# Patient Record
Sex: Female | Born: 1955
Health system: Southern US, Community
[De-identification: ages and names within clinical notes are randomized; demographics above are authoritative.]

## PROBLEM LIST (undated history)

## (undated) DIAGNOSIS — B182 Chronic viral hepatitis C: Secondary | ICD-10-CM

## (undated) DIAGNOSIS — I10 Essential (primary) hypertension: Secondary | ICD-10-CM

## (undated) DIAGNOSIS — Z5189 Encounter for other specified aftercare: Secondary | ICD-10-CM

## (undated) HISTORY — DX: Encounter for other specified aftercare: Z51.89

## (undated) HISTORY — PX: ABDOMINAL HYSTERECTOMY: SHX81

## (undated) HISTORY — DX: Essential (primary) hypertension: I10

---
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
## 2000-04-19 ENCOUNTER — Ambulatory Visit (HOSPITAL_COMMUNITY): Admission: RE | Admit: 2000-04-19 | Discharge: 2000-04-19 | Payer: Self-pay | Admitting: Family Medicine

## 2000-04-19 ENCOUNTER — Encounter: Payer: Self-pay | Admitting: Family Medicine

## 2014-03-18 ENCOUNTER — Inpatient Hospital Stay (HOSPITAL_COMMUNITY)
Admission: AD | Admit: 2014-03-18 | Payer: BC Managed Care – PPO | Source: Ambulatory Visit | Admitting: Obstetrics and Gynecology

## 2018-12-21 ENCOUNTER — Other Ambulatory Visit: Payer: Self-pay

## 2018-12-21 ENCOUNTER — Emergency Department (HOSPITAL_COMMUNITY)
Admission: EM | Admit: 2018-12-21 | Discharge: 2018-12-21 | Disposition: A | Discharge status: Home / Self Care | Payer: BLUE CROSS/BLUE SHIELD | Attending: Emergency Medicine | Admitting: Emergency Medicine

## 2018-12-21 ENCOUNTER — Encounter (HOSPITAL_COMMUNITY): Payer: Self-pay

## 2018-12-21 DIAGNOSIS — R5383 Other fatigue: Secondary | ICD-10-CM | POA: Insufficient documentation

## 2018-12-21 DIAGNOSIS — I10 Essential (primary) hypertension: Secondary | ICD-10-CM

## 2018-12-21 DIAGNOSIS — R5381 Other malaise: Secondary | ICD-10-CM | POA: Insufficient documentation

## 2018-12-21 DIAGNOSIS — T7840XA Allergy, unspecified, initial encounter: Secondary | ICD-10-CM | POA: Diagnosis present

## 2018-12-21 LAB — COMPREHENSIVE METABOLIC PANEL
ALT: 115 U/L — ABNORMAL HIGH (ref 0–44)
AST: 67 U/L — ABNORMAL HIGH (ref 15–41)
Albumin: 3.8 g/dL (ref 3.5–5.0)
Alkaline Phosphatase: 97 U/L (ref 38–126)
Anion gap: 10 (ref 5–15)
BUN: 9 mg/dL (ref 8–23)
CO2: 25 mmol/L (ref 22–32)
Calcium: 8.8 mg/dL — ABNORMAL LOW (ref 8.9–10.3)
Chloride: 103 mmol/L (ref 98–111)
Creatinine, Ser: 0.66 mg/dL (ref 0.44–1.00)
GFR calc Af Amer: >60 mL/min (ref >60)
GFR calc non Af Amer: >60 mL/min (ref >60)
Glucose, Bld: 93 mg/dL (ref 70–99)
Potassium: 4.3 mmol/L (ref 3.5–5.1)
Sodium: 138 mmol/L (ref 135–145)
Total Bilirubin: 0.5 mg/dL (ref 0.3–1.2)
Total Protein: 7.3 g/dL (ref 6.5–8.1)

## 2018-12-21 LAB — CBC WITH DIFFERENTIAL/PLATELET
Abs Immature Granulocytes: 0.01 10*3/uL (ref 0.00–0.07)
Basophils Absolute: 0 10*3/uL (ref 0.0–0.1)
Basophils Relative: 1 %
Eosinophils Absolute: 0.2 10*3/uL (ref 0.0–0.5)
Eosinophils Relative: 3 %
HCT: 41.7 % (ref 36.0–46.0)
Hemoglobin: 13.2 g/dL (ref 12.0–15.0)
Immature Granulocytes: 0 %
Lymphocytes Relative: 36 %
Lymphs Abs: 2.4 10*3/uL (ref 0.7–4.0)
MCH: 28.8 pg (ref 26.0–34.0)
MCHC: 31.7 g/dL (ref 30.0–36.0)
MCV: 91 fL (ref 80.0–100.0)
Monocytes Absolute: 0.9 10*3/uL (ref 0.1–1.0)
Monocytes Relative: 13 %
Neutro Abs: 3.2 10*3/uL (ref 1.7–7.7)
Neutrophils Relative %: 47 %
Platelets: 183 10*3/uL (ref 150–400)
RBC: 4.58 MIL/uL (ref 3.87–5.11)
RDW: 14.3 % (ref 11.5–15.5)
WBC: 6.8 10*3/uL (ref 4.0–10.5)
nRBC: 0 % (ref 0.0–0.2)

## 2018-12-21 MED ORDER — LISINOPRIL 10 MG PO TABS: 10.0000 mg | ORAL_TABLET | Freq: Every day | ORAL | 0 refills | Status: DC

## 2018-12-21 NOTE — Discharge Instructions (Signed)
Recommend calling your primary doctor's office to get help setting up new primary care doctor.  Recommend taking the newly prescribed medicine.  If you develop any cough after starting his medicine, recommend stopping medication and discussing other with your primary care doctor.  If you develop any tongue swelling, lip swelling after starting this medicine, please return to the emergency department for assessment and stop taking the medication.  If you develop fever, difficulty breathing, neck stiffness, difficulty swallowing, or other new concerning symptoms recommend returning here for reassessment.

## 2018-12-21 NOTE — ED Notes (Signed)
Discharge instructions reviewed with patient. Patient verbalizes understanding. VSS.  Patient verbalizes intention to call and schedule PCP appointment on her way out of the ED parking lot.

## 2018-12-21 NOTE — ED Triage Notes (Addendum)
Patient reports Monday night she noticed a bug bite on right hand and right arm. Arm was itchy and achy.  Thursday at work patient began to have a headache, fatigue, and lightheaded.   Reports swollen lymph nodes.   C/o bloating, cramps in lower abdomen, and hot flashes.   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   BP- 212/111

## 2018-12-21 NOTE — ED Provider Notes (Signed)
[facility] DEPT Provider Note   CSN: [id number] Arrival date & time: 12/21/18  1201    History   Chief Complaint Chief Complaint  Patient presents with  . Allergic Reaction  . Fatigue    HPI Debbie Barnett is a 63 y.o. female.  Presents emerged department with multiple complaints.  Patient states over the past week she has felt increased generalized fatigue, generalized weakness.  Last week also having some intermittent headaches.  Currently no headache.  States that she feels more tired than normal.  Last week, she was having some lower abdominal cramping that reminded her of period cramps.  Also states last week she was having some hot flashes.  Has a hysterectomy in the 90s.  Patient also reported that on Monday night shegot two bug bites.  One on her right hand, one on her right forearm.  After this she noted significant area of redness, swelling that tracked up her right forearm.  States that this spontaneously subsided and now is resolved.  States over the past few days though she has noted some increased fullness sensation through her right arm, right side of her neck.  States that she has some neck discomfort sensation on the right side.  Denies frank neck pain, noted some pain with swallowing.  No change in voice, no neck stiffness, no difficulty with swallowing.  Patient denies history of any medical problems.  Takes no chronic medications.  States her PCP retired after [employer] pandemic started and this is why she came here today because she currently does not have a PCP but is interested in getting set up with one.      HPI  History reviewed. No pertinent past medical history.  There are no active problems to display for this patient.   History reviewed. No pertinent surgical history.   OB History   No obstetric history on file.      Home Medications    Prior to Admission medications   Medication Sig Start Date End Date Taking?  Authorizing Provider  lisinopril (ZESTRIL) 10 MG tablet Take 1 tablet (10 mg total) by mouth daily. 12/21/18   Lucrezia Starch, MD    Family History No family history on file.  Social History Social History   Tobacco Use  . Smoking status: Never Smoker  . Smokeless tobacco: Never Used  Substance Use Topics  . Alcohol use: Never    Frequency: Never  . Drug use: Not on file     Allergies   Patient has no allergy information on record.   Review of Systems Review of Systems  Constitutional: Positive for fatigue. Negative for chills and fever.  HENT: Negative for ear pain, sore throat and voice change.   Eyes: Negative for pain and visual disturbance.  Respiratory: Negative for cough and shortness of breath.   Cardiovascular: Negative for chest pain and palpitations.  Gastrointestinal: Negative for abdominal pain and vomiting.  Genitourinary: Negative for dysuria and hematuria.  Musculoskeletal: Negative for arthralgias and back pain.  Skin: Positive for rash. Negative for color change.  Neurological: Negative for seizures and syncope.  All other systems reviewed and are negative.    Physical Exam Updated Vital Signs BP (!) 206/98   Pulse 72   Temp 98.3 F (36.8 C) (Oral)   Resp 18   SpO2 99%   Physical Exam Vitals signs and nursing note reviewed.  Constitutional:      General: She is not in acute distress.  Appearance: She is well-developed.  HENT:     Head: Normocephalic and atraumatic.     Nose: Nose normal. No congestion.     Mouth/Throat:     Mouth: Mucous membranes are moist.     Pharynx: Oropharynx is clear. No oropharyngeal exudate.  Eyes:     Conjunctiva/sclera: Conjunctivae normal.     Pupils: Pupils are equal, round, and reactive to light.  Neck:     Musculoskeletal: Neck supple. No neck rigidity or muscular tenderness.  Cardiovascular:     Rate and Rhythm: Normal rate and regular rhythm.     Heart sounds: No murmur.  Pulmonary:      Effort: Pulmonary effort is normal. No respiratory distress.     Breath sounds: Normal breath sounds.  Abdominal:     Palpations: Abdomen is soft.     Tenderness: There is no abdominal tenderness.  Musculoskeletal:        General: No swelling or tenderness.  Lymphadenopathy:     Cervical: No cervical adenopathy.  Skin:    General: Skin is warm and dry.     Capillary Refill: Capillary refill takes less than 2 seconds.  Neurological:     Mental Status: She is alert.      ED Treatments / Results  Labs (all labs ordered are listed, but only abnormal results are displayed) Labs Reviewed  COMPREHENSIVE METABOLIC PANEL - Abnormal; Notable for the following components:      Result Value   Calcium 8.8 (*)    AST 67 (*)    ALT 115 (*)    All other components within normal limits  CBC WITH DIFFERENTIAL/PLATELET    EKG None  Radiology No results found.  Procedures Procedures (including critical care time)  Medications Ordered in ED Medications - No data to display   Initial Impression / Assessment and Plan / ED Course  I have reviewed the triage vital signs and the nursing notes.  Pertinent labs & imaging results that were available during my care of the patient were reviewed by me and considered in my medical decision making (see chart for details).        62 year old lady presents with 1 week history of generalized fatigue and right arm, right neck fullness sensation after recent insect bites to right hand.  Regarding generalized fatigue symptoms, no clear etiology at this time.  Suspect possible viral illness given consolation of symptoms.  Did not appreciate any significant swelling in her neck, normal neck range of motion, clear posterior oropharynx.  Labs were unremarkable.  At time of presentation here patient was very well-appearing, normal vital signs, except high blood pressure.  Suspect undiagnosed essential hypertension. Will start course of antihypertensive and  urged close PCP follow-up.  Final Clinical Impressions(s) / ED Diagnoses   Final diagnoses:  Malaise and fatigue  Hypertension, unspecified type    ED Discharge Orders         Ordered    lisinopril (ZESTRIL) 10 MG tablet  Daily,   Status:  Discontinued     12/21/18 1407    lisinopril (ZESTRIL) 10 MG tablet  Daily     12/21/18 1410           Lucrezia Starch, MD 12/21/18 1413

## 2019-01-16 ENCOUNTER — Inpatient Hospital Stay: Payer: BLUE CROSS/BLUE SHIELD | Admitting: Family Medicine

## 2019-01-17 ENCOUNTER — Encounter: Payer: Self-pay | Admitting: Pharmacist

## 2019-01-17 ENCOUNTER — Encounter: Payer: Self-pay | Admitting: Critical Care Medicine

## 2019-01-17 ENCOUNTER — Other Ambulatory Visit: Payer: Self-pay

## 2019-01-17 ENCOUNTER — Ambulatory Visit: Payer: BLUE CROSS/BLUE SHIELD | Attending: Critical Care Medicine | Admitting: Critical Care Medicine

## 2019-01-17 ENCOUNTER — Ambulatory Visit (HOSPITAL_BASED_OUTPATIENT_CLINIC_OR_DEPARTMENT_OTHER): Payer: BLUE CROSS/BLUE SHIELD | Admitting: Pharmacist

## 2019-01-17 VITALS — BP 158/80 | HR 70 | Temp 98.2°F | Wt 160.2 lb

## 2019-01-17 DIAGNOSIS — Z23 Encounter for immunization: Secondary | ICD-10-CM

## 2019-01-17 DIAGNOSIS — Z1239 Encounter for other screening for malignant neoplasm of breast: Secondary | ICD-10-CM

## 2019-01-17 DIAGNOSIS — Z114 Encounter for screening for human immunodeficiency virus [HIV]: Secondary | ICD-10-CM

## 2019-01-17 DIAGNOSIS — R51 Headache: Secondary | ICD-10-CM | POA: Diagnosis not present

## 2019-01-17 DIAGNOSIS — I10 Essential (primary) hypertension: Secondary | ICD-10-CM | POA: Diagnosis not present

## 2019-01-17 DIAGNOSIS — Z1159 Encounter for screening for other viral diseases: Secondary | ICD-10-CM

## 2019-01-17 DIAGNOSIS — Z1211 Encounter for screening for malignant neoplasm of colon: Secondary | ICD-10-CM

## 2019-01-17 MED ORDER — LISINOPRIL-HYDROCHLOROTHIAZIDE 20-25 MG PO TABS: 1.0000 | ORAL_TABLET | Freq: Every day | ORAL | 1 refills | Status: DC

## 2019-01-17 NOTE — Progress Notes (Signed)
Patient presents for vaccination against influenza and tetanus per orders of Dr. Joya Gaskins. Consent given. Counseling provided. No contraindications exists. Vaccine administered without incident.

## 2019-01-17 NOTE — Patient Instructions (Signed)
Follow a Dash diet as outlined below for high blood pressure  Change lisinopril to lisinopril HCT 1 tablet daily, this medication was sent to your [facility] pharmacy  We will schedule a mammogram and give you a stool kit to screen for colon cancer  Tetanus vaccine and flu vaccine was given  Labs today include a hepatitis C, HIV screening along with complete metabolic panel complete blood count lipid panel and thyroid panel we will call you with results  Return in 1 month for blood pressure recheck and repeat evaluation with Dr. Joya Gaskins   Managing Your Hypertension Hypertension is commonly called high blood pressure. This is when the force of your blood pressing against the walls of your arteries is too strong. Arteries are blood vessels that carry blood from your heart throughout your body. Hypertension forces the heart to work harder to pump blood, and may cause the arteries to become narrow or stiff. Having untreated or uncontrolled hypertension can cause heart attack, stroke, kidney disease, and other problems. What are blood pressure readings? A blood pressure reading consists of a higher number over a lower number. Ideally, your blood pressure should be below 120/80. The first ("top") number is called the systolic pressure. It is a measure of the pressure in your arteries as your heart beats. The second ("bottom") number is called the diastolic pressure. It is a measure of the pressure in your arteries as the heart relaxes. What does my blood pressure reading mean? Blood pressure is classified into four stages. Based on your blood pressure reading, your health care provider may use the following stages to determine what type of treatment you need, if any. Systolic pressure and diastolic pressure are measured in a unit called mm Hg. Normal  Systolic pressure: below 277.  Diastolic pressure: below 80. Elevated  Systolic pressure: 824-235.  Diastolic pressure: below 80. Hypertension stage  1  Systolic pressure: 361-443.  Diastolic pressure: 15-40. Hypertension stage 2  Systolic pressure: 086 or above.  Diastolic pressure: 90 or above. What health risks are associated with hypertension? Managing your hypertension is an important responsibility. Uncontrolled hypertension can lead to:  A heart attack.  A stroke.  A weakened blood vessel (aneurysm).  Heart failure.  Kidney damage.  Eye damage.  Metabolic syndrome.  Memory and concentration problems. What changes can I make to manage my hypertension? Hypertension can be managed by making lifestyle changes and possibly by taking medicines. Your health care provider will help you make a plan to bring your blood pressure within a normal range. Eating and drinking   Eat a diet that is high in fiber and potassium, and low in salt (sodium), added sugar, and fat. An example eating plan is called the DASH (Dietary Approaches to Stop Hypertension) diet. To eat this way: ? Eat plenty of fresh fruits and vegetables. Try to fill half of your plate at each meal with fruits and vegetables. ? Eat whole grains, such as whole wheat pasta, brown rice, or whole grain bread. Fill about one quarter of your plate with whole grains. ? Eat low-fat diary products. ? Avoid fatty cuts of meat, processed or cured meats, and poultry with skin. Fill about one quarter of your plate with lean proteins such as fish, chicken without skin, beans, eggs, and tofu. ? Avoid premade and processed foods. These tend to be higher in sodium, added sugar, and fat.  Reduce your daily sodium intake. Most people with hypertension should eat less than 1,500 mg of sodium a  day.  Limit alcohol intake to no more than 1 drink a day for nonpregnant women and 2 drinks a day for men. One drink equals 12 oz of beer, 5 oz of wine, or 1 oz of hard liquor. Lifestyle  Work with your health care provider to maintain a healthy body weight, or to lose weight. Ask what an  ideal weight is for you.  Get at least 30 minutes of exercise that causes your heart to beat faster (aerobic exercise) most days of the week. Activities may include walking, swimming, or biking.  Include exercise to strengthen your muscles (resistance exercise), such as weight lifting, as part of your weekly exercise routine. Try to do these types of exercises for 30 minutes at least 3 days a week.  Do not use any products that contain nicotine or tobacco, such as cigarettes and e-cigarettes. If you need help quitting, ask your health care provider.  Control any long-term (chronic) conditions you have, such as high cholesterol or diabetes. Monitoring  Monitor your blood pressure at home as told by your health care provider. Your personal target blood pressure may vary depending on your medical conditions, your age, and other factors.  Have your blood pressure checked regularly, as often as told by your health care provider. Working with your health care provider  Review all the medicines you take with your health care provider because there may be side effects or interactions.  Talk with your health care provider about your diet, exercise habits, and other lifestyle factors that may be contributing to hypertension.  Visit your health care provider regularly. Your health care provider can help you create and adjust your plan for managing hypertension. Will I need medicine to control my blood pressure? Your health care provider may prescribe medicine if lifestyle changes are not enough to get your blood pressure under control, and if:  Your systolic blood pressure is 130 or higher.  Your diastolic blood pressure is 80 or higher. Take medicines only as told by your health care provider. Follow the directions carefully. Blood pressure medicines must be taken as prescribed. The medicine does not work as well when you skip doses. Skipping doses also puts you at risk for problems. Contact a  health care provider if:  You think you are having a reaction to medicines you have taken.  You have repeated (recurrent) headaches.  You feel dizzy.  You have swelling in your ankles.  You have trouble with your vision. Get help right away if:  You develop a severe headache or confusion.  You have unusual weakness or numbness, or you feel faint.  You have severe pain in your chest or abdomen.  You vomit repeatedly.  You have trouble breathing. Summary  Hypertension is when the force of blood pumping through your arteries is too strong. If this condition is not controlled, it may put you at risk for serious complications.  Your personal target blood pressure may vary depending on your medical conditions, your age, and other factors. For most people, a normal blood pressure is less than 120/80.  Hypertension is managed by lifestyle changes, medicines, or both. Lifestyle changes include weight loss, eating a healthy, low-sodium diet, exercising more, and limiting alcohol. This information is not intended to replace advice given to you by your health care provider. Make sure you discuss any questions you have with your health care provider. Document Released: 01/25/2012 Document Revised: 08/24/2018 Document Reviewed: 03/30/2016 Elsevier Patient Education  [place].  Hypertension,  Adult Hypertension is another name for high blood pressure. High blood pressure forces your heart to work harder to pump blood. This can cause problems over time. There are two numbers in a blood pressure reading. There is a top number (systolic) over a bottom number (diastolic). It is best to have a blood pressure that is below 120/80. Healthy choices can help lower your blood pressure, or you may need medicine to help lower it. What are the causes? The cause of this condition is not known. Some conditions may be related to high blood pressure. What increases the risk?  Smoking.  Having type  2 diabetes mellitus, high cholesterol, or both.  Not getting enough exercise or physical activity.  Being overweight.  Having too much fat, sugar, calories, or salt (sodium) in your diet.  Drinking too much alcohol.  Having long-term (chronic) kidney disease.  Having a family history of high blood pressure.  Age. Risk increases with age.  Race. You may be at higher risk if you are African American.  Gender. Men are at higher risk than women before age 6. After age 24, women are at higher risk than men.  Having obstructive sleep apnea.  Stress. What are the signs or symptoms?  High blood pressure may not cause symptoms. Very high blood pressure (hypertensive crisis) may cause: ? Headache. ? Feelings of worry or nervousness (anxiety). ? Shortness of breath. ? Nosebleed. ? A feeling of being sick to your stomach (nausea). ? Throwing up (vomiting). ? Changes in how you see. ? Very bad chest pain. ? Seizures. How is this treated?  This condition is treated by making healthy lifestyle changes, such as: ? Eating healthy foods. ? Exercising more. ? Drinking less alcohol.  Your health care provider may prescribe medicine if lifestyle changes are not enough to get your blood pressure under control, and if: ? Your top number is above 130. ? Your bottom number is above 80.  Your personal target blood pressure may vary. Follow these instructions at home: Eating and drinking   If told, follow the DASH eating plan. To follow this plan: ? Fill one half of your plate at each meal with fruits and vegetables. ? Fill one fourth of your plate at each meal with whole grains. Whole grains include whole-wheat pasta, brown rice, and whole-grain bread. ? Eat or drink low-fat dairy products, such as skim milk or low-fat yogurt. ? Fill one fourth of your plate at each meal with low-fat (lean) proteins. Low-fat proteins include fish, chicken without skin, eggs, beans, and tofu. ? Avoid  fatty meat, cured and processed meat, or chicken with skin. ? Avoid pre-made or processed food.  Eat less than 1,500 mg of salt each day.  Do not drink alcohol if: ? Your doctor tells you not to drink. ? You are pregnant, may be pregnant, or are planning to become pregnant.  If you drink alcohol: ? Limit how much you use to:  0-1 drink a day for women.  0-2 drinks a day for men. ? Be aware of how much alcohol is in your drink. In the [place] one drink equals one 12 oz bottle of beer (355 mL), one 5 oz glass of wine (148 mL), or one 1 oz glass of hard liquor (44 mL). Lifestyle   Work with your doctor to stay at a healthy weight or to lose weight. Ask your doctor what the best weight is for you.  Get at least 30 minutes of exercise most  days of the week. This may include walking, swimming, or biking.  Get at least 30 minutes of exercise that strengthens your muscles (resistance exercise) at least 3 days a week. This may include lifting weights or doing Pilates.  Do not use any products that contain nicotine or tobacco, such as cigarettes, e-cigarettes, and chewing tobacco. If you need help quitting, ask your doctor.  Check your blood pressure at home as told by your doctor.  Keep all follow-up visits as told by your doctor. This is important. Medicines  Take over-the-counter and prescription medicines only as told by your doctor. Follow directions carefully.  Do not skip doses of blood pressure medicine. The medicine does not work as well if you skip doses. Skipping doses also puts you at risk for problems.  Ask your doctor about side effects or reactions to medicines that you should watch for. Contact a doctor if you:  Think you are having a reaction to the medicine you are taking.  Have headaches that keep coming back (recurring).  Feel dizzy.  Have swelling in your ankles.  Have trouble with your vision. Get help right away if you:  Get a very bad headache.  Start  to feel mixed up (confused).  Feel weak or numb.  Feel faint.  Have very bad pain in your: ? Chest. ? Belly (abdomen).  Throw up more than once.  Have trouble breathing. Summary  Hypertension is another name for high blood pressure.  High blood pressure forces your heart to work harder to pump blood.  For most people, a normal blood pressure is less than 120/80.  Making healthy choices can help lower blood pressure. If your blood pressure does not get lower with healthy choices, you may need to take medicine. This information is not intended to replace advice given to you by your health care provider. Make sure you discuss any questions you have with your health care provider. Document Released: 10/19/2007 Document Revised: 01/10/2018 Document Reviewed: 01/10/2018 Elsevier Patient Education  2020 [employer].

## 2019-01-17 NOTE — Progress Notes (Signed)
Subjective:    Patient ID: Debbie Barnett, female    DOB: 12-Sep-1955, 63 y.o.   MRN: [id number]  This is a pleasant 63 year old female who presented to the emergency room recently with fatigue weakness right arm swelling and neck pain which appear to generate from 2 insect bites.  This occurred in August and she was treated in the emergency room at that time she was found to have elevated blood pressure and they recommended institution of lisinopril 10 mg daily and referred her to our clinic to establish.  Patient's not had primary care in several years.  She does have previous history of hysterectomy in the 1990s.  She has no real headache there is no chest pain there is no shortness of breath there is no swelling in the lower extremities as well.  The patient is a lifelong never smoker and does not use alcohol the patient works for the [employer]   Hypertension This is a new problem. The current episode started more than 1 month ago. The problem is unchanged. The problem is uncontrolled. Associated symptoms include headaches. Pertinent negatives include no anxiety, blurred vision, chest pain, malaise/fatigue, neck pain, orthopnea, palpitations, peripheral edema, PND, shortness of breath or sweats. There are no known risk factors for coronary artery disease. Past treatments include ACE inhibitors. The current treatment provides moderate improvement. Compliance problems include exercise and diet.  There is no history of angina, kidney disease, CAD/MI, CVA, heart failure, left ventricular hypertrophy, PVD or retinopathy. There is no history of chronic renal disease, coarctation of the aorta, hyperaldosteronism, hypercortisolism, hyperparathyroidism, a hypertension causing med, pheochromocytoma, renovascular disease, sleep apnea or a thyroid problem.   Past Medical History:  Diagnosis Date  . Hypertension      History reviewed. No pertinent family history.   Social History    Socioeconomic History  . Marital status: Single    Spouse name: Not on file  . Number of children: Not on file  . Years of education: Not on file  . Highest education level: Not on file  Occupational History  . Not on file  Social Needs  . Financial resource strain: Not on file  . Food insecurity    Worry: Not on file    Inability: Not on file  . Transportation needs    Medical: Not on file    Non-medical: Not on file  Tobacco Use  . Smoking status: Never Smoker  . Smokeless tobacco: Never Used  Substance and Sexual Activity  . Alcohol use: Never    Frequency: Never  . Drug use: Never  . Sexual activity: Yes  Lifestyle  . Physical activity    Days per week: Not on file    Minutes per session: Not on file  . Stress: Not on file  Relationships  . Social Herbalist on phone: Not on file    Gets together: Not on file    Attends religious service: Not on file    Active member of club or organization: Not on file    Attends meetings of clubs or organizations: Not on file    Relationship status: Not on file  . Intimate partner violence    Fear of current or ex partner: Not on file    Emotionally abused: Not on file    Physically abused: Not on file    Forced sexual activity: Not on file  Other Topics Concern  . Not on file  Social History  Narrative  . Not on file     No Known Allergies   Outpatient Medications Prior to Visit  Medication Sig Dispense Refill  . lisinopril (ZESTRIL) 10 MG tablet Take 1 tablet (10 mg total) by mouth daily. 30 tablet 0   No facility-administered medications prior to visit.       Review of Systems  Constitutional: Negative for malaise/fatigue.  Eyes: Negative for blurred vision.  Respiratory: Negative for shortness of breath.   Cardiovascular: Negative for chest pain, palpitations, orthopnea and PND.  Musculoskeletal: Negative for neck pain.  Neurological: Positive for headaches.       Objective:   Physical Exam  Vitals:   01/17/19 1047  BP: (!) 158/80  Pulse: 70  Temp: 98.2 F (36.8 C)  TempSrc: Oral  SpO2: 98%  Weight: 160 lb 3.2 oz (72.7 kg)    Gen: Pleasant, well-nourished, in no distress,  normal affect  ENT: No lesions,  mouth clear,  oropharynx clear, no postnasal drip  Neck: No JVD, no TMG, no carotid bruits  Lungs: No use of accessory muscles, no dullness to percussion, clear without rales or rhonchi  Cardiovascular: RRR, heart sounds normal, no murmur or gallops, no peripheral edema  Abdomen: soft and NT, no HSM,  BS normal  Musculoskeletal: No deformities, no cyanosis or clubbing  Neuro: alert, non focal  Skin: Warm, no lesions or rashes   BMP Latest Ref Rng & Units 12/21/2018  Glucose 70 - 99 mg/dL 93  BUN 8 - 23 mg/dL 9  Creatinine 0.44 - 1.00 mg/dL 0.66  Sodium 135 - 145 mmol/L 138  Potassium 3.5 - 5.1 mmol/L 4.3  Chloride 98 - 111 mmol/L 103  CO2 22 - 32 mmol/L 25  Calcium 8.9 - 10.3 mg/dL 8.8(L)   Hepatic Function Latest Ref Rng & Units 12/21/2018  Total Protein 6.5 - 8.1 g/dL 7.3  Albumin 3.5 - 5.0 g/dL 3.8  AST 15 - 41 U/L 67(H)  ALT 0 - 44 U/L 115(H)  Alk Phosphatase 38 - 126 U/L 97  Total Bilirubin 0.3 - 1.2 mg/dL 0.5   CBC Latest Ref Rng & Units 12/21/2018  WBC 4.0 - 10.5 K/uL 6.8  Hemoglobin 12.0 - 15.0 g/dL 13.2  Hematocrit 36.0 - 46.0 % 41.7  Platelets 150 - 400 K/uL 183        Assessment & Plan:  I personally reviewed all images and lab data in the [facility] system as well as any outside material available during this office visit and agree with the  radiology impressions.   HTN (hypertension) Hypertension newly diagnosed but not yet well controlled on 10 mg daily lisinopril  Plan will be to increase to lisinopril HCT 20/25 mg daily  We will recheck a complete metabolic panel complete blood count thyroid panel lipid panel  A low-salt DASH diet was given to the patient for consideration and she was recommended to increase her daily exercise      Debbie Barnett was seen today for hospitalization follow-up.  Diagnoses and all orders for this visit:  Essential hypertension -     Comprehensive metabolic panel -     CBC with Differential/Platelet; Future -     Lipid panel -     Thyroid Profile -     TSH -     CBC with Differential/Platelet  Colon cancer screening -     Fecal occult blood, imunochemical  Encounter for screening for HIV -     HIV Antibody (routine testing w rflx)  Breast cancer screening -     MM DIGITAL SCREENING BILATERAL; Future  Need for hepatitis C screening test -     Hepatitis C antibody  Other orders -     lisinopril-hydrochlorothiazide (ZESTORETIC) 20-25 MG tablet; Take 1 tablet by mouth daily.  We also will order a mammogram, HIV screening, hepatitis C screening along with fecal occult blood for colon cancer screening  We also administered a flu vaccine today at this visit  We will have the patient establish and return in follow-up note she has had a hysterectomy therefore does not need a Pap smear any longer as it was a complete hysterectomy

## 2019-01-17 NOTE — Assessment & Plan Note (Signed)
Hypertension newly diagnosed but not yet well controlled on 10 mg daily lisinopril  Plan will be to increase to lisinopril HCT 20/25 mg daily  We will recheck a complete metabolic panel complete blood count thyroid panel lipid panel  A low-salt DASH diet was given to the patient for consideration and she was recommended to increase her daily exercise

## 2019-01-18 ENCOUNTER — Telehealth: Payer: Self-pay | Admitting: Critical Care Medicine

## 2019-01-18 DIAGNOSIS — R768 Other specified abnormal immunological findings in serum: Secondary | ICD-10-CM

## 2019-01-18 DIAGNOSIS — B182 Chronic viral hepatitis C: Secondary | ICD-10-CM

## 2019-01-18 HISTORY — DX: Chronic viral hepatitis C: B18.2

## 2019-01-18 LAB — HEPATITIS C ANTIBODY: Hep C Virus Ab: >11 s/co ratio — ABNORMAL HIGH (ref 0.0–0.9)

## 2019-01-18 LAB — COMPREHENSIVE METABOLIC PANEL
ALT: 34 IU/L — ABNORMAL HIGH (ref 0–32)
AST: 33 IU/L (ref 0–40)
Albumin/Globulin Ratio: 1.8 (ref 1.2–2.2)
Albumin: 4.3 g/dL (ref 3.8–4.8)
Alkaline Phosphatase: 87 IU/L (ref 39–117)
BUN/Creatinine Ratio: 25 (ref 12–28)
BUN: 17 mg/dL (ref 8–27)
Bilirubin Total: 0.3 mg/dL (ref 0.0–1.2)
CO2: 24 mmol/L (ref 20–29)
Calcium: 9.4 mg/dL (ref 8.7–10.3)
Chloride: 103 mmol/L (ref 96–106)
Creatinine, Ser: 0.67 mg/dL (ref 0.57–1.00)
GFR calc Af Amer: 108 mL/min/{1.73_m2} (ref >59)
GFR calc non Af Amer: 94 mL/min/{1.73_m2} (ref >59)
Globulin, Total: 2.4 g/dL (ref 1.5–4.5)
Glucose: 105 mg/dL — ABNORMAL HIGH (ref 65–99)
Potassium: 4.5 mmol/L (ref 3.5–5.2)
Sodium: 140 mmol/L (ref 134–144)
Total Protein: 6.7 g/dL (ref 6.0–8.5)

## 2019-01-18 LAB — LIPID PANEL
Chol/HDL Ratio: 3.3 ratio (ref 0.0–4.4)
Cholesterol, Total: 174 mg/dL (ref 100–199)
HDL: 53 mg/dL (ref >39)
LDL Chol Calc (NIH): 104 mg/dL — ABNORMAL HIGH (ref 0–99)
Triglycerides: 91 mg/dL (ref 0–149)
VLDL Cholesterol Cal: 17 mg/dL (ref 5–40)

## 2019-01-18 LAB — CBC WITH DIFFERENTIAL/PLATELET
Basophils Absolute: 0 10*3/uL (ref 0.0–0.2)
Basos: 1 %
EOS (ABSOLUTE): 0.2 10*3/uL (ref 0.0–0.4)
Eos: 3 %
Hematocrit: 37.9 % (ref 34.0–46.6)
Hemoglobin: 12.9 g/dL (ref 11.1–15.9)
Immature Grans (Abs): 0 10*3/uL (ref 0.0–0.1)
Immature Granulocytes: 0 %
Lymphocytes Absolute: 2.4 10*3/uL (ref 0.7–3.1)
Lymphs: 39 %
MCH: 29.1 pg (ref 26.6–33.0)
MCHC: 34 g/dL (ref 31.5–35.7)
MCV: 85 fL (ref 79–97)
Monocytes Absolute: 0.6 10*3/uL (ref 0.1–0.9)
Monocytes: 10 %
Neutrophils Absolute: 2.9 10*3/uL (ref 1.4–7.0)
Neutrophils: 47 %
Platelets: 214 10*3/uL (ref 150–450)
RBC: 4.44 x10E6/uL (ref 3.77–5.28)
RDW: 13.7 % (ref 11.7–15.4)
WBC: 6.1 10*3/uL (ref 3.4–10.8)

## 2019-01-18 LAB — THYROID PANEL
Free Thyroxine Index: 2.4 (ref 1.2–4.9)
T3 Uptake Ratio: 22 % — ABNORMAL LOW (ref 24–39)
T4, Total: 11.1 ug/dL (ref 4.5–12.0)

## 2019-01-18 LAB — HIV ANTIBODY (ROUTINE TESTING W REFLEX): HIV Screen 4th Generation wRfx: NONREACTIVE

## 2019-01-18 LAB — TSH: TSH: 2.43 u[IU]/mL (ref 0.450–4.500)

## 2019-01-18 NOTE — Telephone Encounter (Signed)
I called the patient with lab results.  Her Hep C is positive, I will order f/u studies. The pt will come in for lab draw at [facility] on Tuesday

## 2019-01-22 ENCOUNTER — Ambulatory Visit: Payer: BLUE CROSS/BLUE SHIELD | Attending: Family Medicine

## 2019-01-22 ENCOUNTER — Other Ambulatory Visit: Payer: Self-pay

## 2019-01-22 DIAGNOSIS — R768 Other specified abnormal immunological findings in serum: Secondary | ICD-10-CM

## 2019-01-24 ENCOUNTER — Other Ambulatory Visit: Payer: Self-pay | Admitting: Critical Care Medicine

## 2019-01-24 DIAGNOSIS — R768 Other specified abnormal immunological findings in serum: Secondary | ICD-10-CM

## 2019-01-24 DIAGNOSIS — B182 Chronic viral hepatitis C: Secondary | ICD-10-CM

## 2019-01-24 HISTORY — DX: Chronic viral hepatitis C: B18.2

## 2019-01-24 LAB — HCV RNA QUANT
HCV log10: 6.076 log10 IU/mL
Hepatitis C Quantitation: 1190000 IU/mL

## 2019-01-24 NOTE — Progress Notes (Signed)
amb ref to Hep C clinic made

## 2019-02-07 ENCOUNTER — Encounter: Payer: BLUE CROSS/BLUE SHIELD | Admitting: Infectious Diseases

## 2019-02-19 ENCOUNTER — Ambulatory Visit: Payer: BLUE CROSS/BLUE SHIELD | Admitting: Critical Care Medicine

## 2019-02-27 ENCOUNTER — Telehealth: Payer: Self-pay | Admitting: Pharmacy Technician

## 2019-02-27 ENCOUNTER — Other Ambulatory Visit: Payer: Self-pay

## 2019-02-27 ENCOUNTER — Ambulatory Visit (INDEPENDENT_AMBULATORY_CARE_PROVIDER_SITE_OTHER): Payer: BLUE CROSS/BLUE SHIELD | Admitting: Infectious Diseases

## 2019-02-27 ENCOUNTER — Encounter: Payer: Self-pay | Admitting: Infectious Diseases

## 2019-02-27 VITALS — BP 159/95 | HR 68 | Temp 98.2°F | Wt 161.0 lb

## 2019-02-27 DIAGNOSIS — R7401 Elevation of levels of liver transaminase levels: Secondary | ICD-10-CM

## 2019-02-27 DIAGNOSIS — B182 Chronic viral hepatitis C: Secondary | ICD-10-CM

## 2019-02-27 NOTE — Progress Notes (Signed)
Debbie Barnett  [id number]  27-Jul-1955    HPI: The patient is a 63 y.o. y/o white female who presents today for an evaluation for HCV.  She had a positive hepatitis C antibody on January 17, 2019 which was run in part for the recent trend of transaminitis and her guidance on her age cohort.  Confirmatory testing on January 22, 2019 showed a positive hepatitis C viral load of 1,190,000 IU.  A concurrent HIV antibody was negative in September 2020.  The patient does admit to a remote solitary episode of IV drug use in the late 1970s in which she awoke having been told by friends that she used cocaine in with a needle still in her arm.  She used to commonly donate blood, and in the early 1990s, she was told she had tested positive for hepatitis and her blood could no longer be received for donation.  She does recall having a blood transfusion in conjunction with a gastroenteritis/food poisoning episode in 1996 while she was treated in a hospital [place], [place].  She does recall a jaundicing illness prior to that hospital admission.  She denies using IV drug use beyond that one episode many years ago and is never had any tattoos or body piercings other than ear piercings performed.  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  She does recall occasional needlesticks with vaccines and blood draws when attempting to draw blood from animals but denies any human to human exposure occupationally.  All of her sexual relationships have been with consistent condom use with the exception of one 3-year long relationship in the mid 90s.  After the termination of this relationship, she did learn that her former boyfriend did frequent prostitutes and likely abused IV drugs.  Past Medical History:  Diagnosis Date  . Blood transfusion without reported diagnosis    ~1996 with gastroenteritis/food poisoning  . Chronic hepatitis C  ([place]) 01/24/2019  . Hypertension     Past Surgical History:  Procedure Laterality Date  . ABDOMINAL HYSTERECTOMY       Family History  Problem Relation Age of Onset  . Kidney disease Mother   . Heart disease Father   . Obesity Sister   . Heart disease Brother   . Hypertension Brother   . Alcohol abuse Maternal Grandfather      Social History   Tobacco Use  . Smoking status: Never Smoker  . Smokeless tobacco: Never Used  Substance Use Topics  . Alcohol use: Yes    Frequency: Never    Comment: rare, approximately twice a year  . Drug use: Not Currently    Types: Cocaine, Marijuana    Comment: remote IVDU once in late 1970s      reports being sexually active. She reports using the following method of birth control/protection: Condom.   No Known Allergies   Outpatient Medications Prior to Visit  Medication Sig Dispense Refill  . lisinopril-hydrochlorothiazide (ZESTORETIC) 20-25 MG tablet Take 1 tablet by mouth daily. 90 tablet 1   No facility-administered medications prior to visit.      Review of Systems  Constitutional: Positive for fatigue. Negative for chills and fever.  HENT: Negative for congestion, hearing loss and sinus pressure.   Eyes: Negative for photophobia, discharge, redness and visual disturbance.  Respiratory: Negative for apnea, cough, shortness of breath and wheezing.   Cardiovascular: Negative for chest pain and leg swelling.  Gastrointestinal: Negative for abdominal distention, abdominal pain, constipation, diarrhea, nausea and vomiting.  Endocrine: Negative for cold intolerance, heat intolerance, polydipsia and polyuria.  Genitourinary: Negative for dysuria, flank pain, frequency, urgency, vaginal bleeding and vaginal discharge.  Musculoskeletal: Negative for arthralgias, back pain, joint swelling and neck pain.  Skin: Negative for pallor and rash.  Allergic/Immunologic: Negative for immunocompromised state.  Neurological: Negative for  dizziness, seizures, speech difficulty, weakness and headaches.  Hematological: Does not bruise/bleed easily.  Psychiatric/Behavioral: Negative for agitation, confusion, hallucinations and sleep disturbance. The patient is not nervous/anxious.      Vitals:   02/27/19 0937  BP: (!) 159/95  Pulse: 68  Temp: 98.2 F (36.8 C)     Physical Exam Gen: pleasant, NAD, A&Ox 3 Head: NCAT, no temporal wasting evident EENT: PERRL, EOMI, MMM, adequate dentition Neck: supple, no JVD CV: NRRR, no murmurs evident Pulm: CTA bilaterally, no wheeze or retractions Abd: soft, NTND, +BS Extrems: no LE edema, 2+ pulses Skin: no rashes, adequate skin turgor Neuro: CN II-XII grossly intact, no focal neurologic deficits appreciated, gait was normal, A&Ox 3   Labs: No results found for: HEPBSAG  HCV viral load on 1.19 million IU on 01/22/2019  No results found for: FIBROSTAGE  No results found for: HCVGENOTYPE  Lab Results  Component Value Date   WBC 6.1 01/17/2019   HGB 12.9 01/17/2019   HCT 37.9 01/17/2019   MCV 85 01/17/2019   PLT 214 01/17/2019       Chemistry      Component Value Date/Time   NA 140 01/17/2019 1135   K 4.5 01/17/2019 1135   CL 103 01/17/2019 1135   CO2 24 01/17/2019 1135   BUN 17 01/17/2019 1135   CREATININE 0.67 01/17/2019 1135      Component Value Date/Time   CALCIUM 9.4 01/17/2019 1135   ALKPHOS 87 01/17/2019 1135   AST 33 01/17/2019 1135   ALT 34 (H) 01/17/2019 1135   BILITOT 0.3 01/17/2019 1135        Assessment/Plan: The patient is a 63 year old white female with recent transaminitis presenting for an evaluation for chronic hepatitis C infection.  HCV - HCV Ab was positive on 01/17/2019, thus confirming past exposure to pathogen. Note: this screening test will remain positive/reactive life-long even if HCV infection has been cured/immunologically cleared. Her most likely risk factor for acquisition was past IV drug use or past sexual transmission, and less  likely her past blood transfusion (unlikely as blood supply has been HCV screened since 1994 when commercial test became available). Her HCV viral load of 1,190,000 IU confirms chronic active hepatitis C infection.  We will check an HCV genotype today. Her recent HIV Ab was negative, thus excluding co-infection.  We will check a hepatitis B surface antibody and hepatitis B surface antigen to assess for need for vaccination and risk for emergence of active hepatitis B while on DAA treatment.  As chronic HCV infection is now confirmed, will proceed with FibroScan to determine stage of fibrosis and best directly active antiviral treatment options. F/u in 3 weeks to review lab/imaging results.   Health maintenance - I have counselled the patient extensively re: the need for barrier precautions with sexual activity in order to prevent sexual transmission of HCV. She must continue these practices until 3 months after treatment completion until a sustained virologic response (SVR) has been confirmed to establish a cure of her infection. She expressed full understanding of these instructions. Vaccination for hepatitis A & B was  recommended as was abstinence from alcohol.  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

## 2019-02-27 NOTE — Telephone Encounter (Signed)
[facility] Patient Advocate Encounter  Met with the patient today during her appointment and gathered demographic data such as household size and income incase we need it for patient assistance. Her insurance plan through [employer] is commercial so more than likely a copay card will cover the cost and make the charge $5.00. If we are able to fill at [facility], the patient prefers to pick up and not have the medication mailed. I provided her information on the pharmacy and the process once labs return and medication selected.

## 2019-02-27 NOTE — Patient Instructions (Signed)
Abstain from all alcohol until liver staging completed. Return to clinic in 4 weeks to review lab results and discuss starting treatment for hepatitis C.

## 2019-02-27 NOTE — Telephone Encounter (Signed)
[facility] Patient Teacher, English as a foreign language completed.    The patient is insured through [employer].  We will continue to follow to see if copay assistance is needed.  Venida Jarvis. Nadara Mustard [place] Patient [facility] for Infectious Disease Phone: [phone] Fax:  [phone]

## 2019-03-02 LAB — HEPATITIS B SURFACE ANTIBODY, QUANTITATIVE: Hep B S AB Quant (Post): <5 m[IU]/mL — ABNORMAL LOW (ref >=10)

## 2019-03-02 LAB — HEPATITIS A ANTIBODY, TOTAL: Hepatitis A AB,Total: REACTIVE — AB

## 2019-03-02 LAB — HEPATITIS C GENOTYPE: HCV Genotype: 2

## 2019-03-02 LAB — HEPATITIS B SURFACE ANTIGEN: Hepatitis B Surface Ag: NONREACTIVE

## 2019-03-11 LAB — FECAL OCCULT BLOOD, IMMUNOCHEMICAL

## 2019-03-27 ENCOUNTER — Ambulatory Visit
Admission: RE | Admit: 2019-03-27 | Discharge: 2019-03-27 | Disposition: A | Discharge status: Home / Self Care | Payer: BLUE CROSS/BLUE SHIELD | Source: Ambulatory Visit | Attending: Infectious Diseases | Admitting: Infectious Diseases

## 2019-04-10 ENCOUNTER — Telehealth: Payer: Self-pay

## 2019-04-10 NOTE — Telephone Encounter (Signed)
Patient called request [place] results ordered by Dr. Prince Rome. After reviewing chart patient was due to return for office visit around 03/27/19. That appointment was not made prior to leaving the office. LPN scheduled patient for next available appointment and patient is fine waiting until that appointment to go iver [place] results.  Debbie Barnett

## 2019-05-01 ENCOUNTER — Telehealth: Payer: Self-pay | Admitting: Family

## 2019-05-01 DIAGNOSIS — K7400 Hepatic fibrosis, unspecified: Secondary | ICD-10-CM

## 2019-05-01 NOTE — Telephone Encounter (Signed)
[name] is a 63 year old female with genotype 2 hepatitis C with viral load of 1.19 million. APRI score of 0.386 and FIB4 of 1.69. Ultrasound with diffuse hepatocellular disease without masses or lesions. Will refer to gastroenterology for further assessment as indicated. Discussed results with [name] with questions answered. Will plan for treatment with Mavyret or Epclusa

## 2019-05-02 ENCOUNTER — Encounter: Payer: Self-pay | Admitting: Gastroenterology

## 2019-05-02 ENCOUNTER — Other Ambulatory Visit: Payer: Self-pay | Admitting: Family

## 2019-05-02 MED ORDER — SOFOSBUVIR-VELPATASVIR 400-100 MG PO TABS: 1.0000 | ORAL_TABLET | Freq: Every day | ORAL | 2 refills | Status: DC

## 2019-05-03 ENCOUNTER — Telehealth: Payer: Self-pay | Admitting: Pharmacy Technician

## 2019-05-03 NOTE — Telephone Encounter (Signed)
[facility] Patient Advocate Encounter   Received notification from [facility] that prior authorization for Raeanne Gathers is required.   PA submitted on 05/03/2019 [facility] Status is pending    [facility] will continue to follow.  Bartholomew Crews, CPhT Specialty Pharmacy Patient [facility] for Infectious Disease Phone: [phone] Fax: [phone] 05/03/2019 11:28 AM

## 2019-05-06 ENCOUNTER — Telehealth: Payer: Self-pay | Admitting: Pharmacist

## 2019-05-06 MED FILL — SOFOSBUVIR-VELPATASVIR 400-: 400-100 | 28 days supply | Qty: 28 | Fill #0

## 2019-05-06 NOTE — Telephone Encounter (Signed)
[facility] Patient Advocate Encounter  Prior Authorization for Debbie Barnett has been approved.    PA# [place] Effective dates: 05/03/2019 through 11/01/2019  Patients co-pay is $1387.33.  [facility] will continue to follow. Will need to apply for a coupon card.  Bartholomew Crews, CPhT Specialty Pharmacy Patient [facility] for Infectious Disease Phone: [phone] Fax: [phone] 05/06/2019 10:56 AM

## 2019-05-06 NOTE — Telephone Encounter (Signed)
Patient is approved to receive Epclusa x 12 weeks for chronic Hepatitis C infection. Counseled patient to take Epclusa daily with or without food. Encouraged patient not to miss any doses and explained how their chance of cure could go down with each dose missed. Counseled patient on what to do if dose is missed - if it is closer to the missed dose take immediately; if closer to next dose skip dose and take the next dose at the usual time.   Counseled patient on common side effects such as headache, fatigue, and nausea and that these normally decrease with time. I reviewed patient medications and found no drug interactions. Discussed with patient that there are several drug interactions including acid suppressants. Instructed patient to call clinic if she wishes to start a new medication during course of therapy. Also advised patient to call if she experiences any side effects. Patient will follow-up with me in the pharmacy clinic on 1/27

## 2019-05-06 NOTE — Telephone Encounter (Signed)
[facility] Patient Advocate Encounter   Was successful in obtaining an [place] card for generic Epclusa coverage. This copay card will make the patients copay $5.00.  I have spoken with the patient and she would like to pick up in person at [facility] due to her work schedule..    The billing information is RxBin: [id number] PCN: [id number] Member ID: [id number] Group ID: [id number]  Bartholomew Crews, CPhT Specialty Pharmacy Patient [facility] for Infectious Disease Phone: [phone] Fax: [phone] 05/06/2019 11:09 AM

## 2019-05-08 ENCOUNTER — Ambulatory Visit: Payer: BLUE CROSS/BLUE SHIELD | Admitting: Infectious Diseases

## 2019-05-08 ENCOUNTER — Encounter: Payer: Self-pay | Admitting: Pharmacy Technician

## 2019-05-15 ENCOUNTER — Ambulatory Visit
Admission: RE | Admit: 2019-05-15 | Discharge: 2019-05-15 | Disposition: A | Discharge status: Home / Self Care | Payer: BLUE CROSS/BLUE SHIELD | Source: Ambulatory Visit | Attending: Critical Care Medicine | Admitting: Critical Care Medicine

## 2019-05-15 ENCOUNTER — Other Ambulatory Visit: Payer: Self-pay

## 2019-05-15 ENCOUNTER — Ambulatory Visit: Payer: BLUE CROSS/BLUE SHIELD | Admitting: Family

## 2019-05-15 DIAGNOSIS — Z1239 Encounter for other screening for malignant neoplasm of breast: Secondary | ICD-10-CM

## 2019-05-30 MED FILL — SOFOSBUVIR-VELPATASVIR 400-: 400-100 | 28 days supply | Qty: 28 | Fill #1

## 2019-06-01 ENCOUNTER — Other Ambulatory Visit: Payer: Self-pay

## 2019-06-01 DIAGNOSIS — Z20822 Contact with and (suspected) exposure to covid-19: Secondary | ICD-10-CM

## 2019-06-02 LAB — NOVEL CORONAVIRUS, NAA: SARS-CoV-2, NAA: DETECTED — AB

## 2019-06-03 ENCOUNTER — Telehealth: Payer: Self-pay | Admitting: Critical Care Medicine

## 2019-06-03 ENCOUNTER — Telehealth: Payer: Self-pay | Admitting: Nurse Practitioner

## 2019-06-03 NOTE — Telephone Encounter (Signed)
I tried to call this patient,  I will call again

## 2019-06-05 ENCOUNTER — Ambulatory Visit (HOSPITAL_COMMUNITY)
Admission: RE | Admit: 2019-06-05 | Discharge: 2019-06-05 | Disposition: A | Discharge status: Home / Self Care | Payer: BLUE CROSS/BLUE SHIELD | Source: Ambulatory Visit | Attending: Pulmonary Disease | Admitting: Pulmonary Disease

## 2019-06-05 ENCOUNTER — Other Ambulatory Visit: Payer: Self-pay | Admitting: Pulmonary Disease

## 2019-06-05 DIAGNOSIS — U071 COVID-19: Secondary | ICD-10-CM | POA: Insufficient documentation

## 2019-06-05 DIAGNOSIS — I10 Essential (primary) hypertension: Secondary | ICD-10-CM

## 2019-06-05 MED ORDER — METHYLPREDNISOLONE SODIUM SUCC 125 MG IJ SOLR: 125.0000 mg | Freq: Once | INTRAMUSCULAR | Status: DC | PRN

## 2019-06-05 MED ORDER — SODIUM CHLORIDE 0.9 % IV SOLN
INTRAVENOUS | Status: DC | PRN
  Administered 2019-06-05: 250 mL via INTRAVENOUS

## 2019-06-05 MED ORDER — EPINEPHRINE 0.3 MG/0.3ML IJ SOAJ: 0.3000 mg | Freq: Once | INTRAMUSCULAR | Status: DC | PRN

## 2019-06-05 MED ORDER — DIPHENHYDRAMINE HCL 50 MG/ML IJ SOLN: 50.0000 mg | Freq: Once | INTRAMUSCULAR | Status: DC | PRN

## 2019-06-05 MED ORDER — SODIUM CHLORIDE 0.9 % IV SOLN
700.0000 mg | Freq: Once | INTRAVENOUS | Status: AC
  Administered 2019-06-05: 700 mg via INTRAVENOUS
  Filled 2019-06-05: qty 20

## 2019-06-05 MED ORDER — FAMOTIDINE IN NACL 20-0.9 MG/50ML-% IV SOLN: 20.0000 mg | Freq: Once | INTRAVENOUS | Status: DC | PRN

## 2019-06-05 MED ORDER — ALBUTEROL SULFATE HFA 108 (90 BASE) MCG/ACT IN AERS: 2.0000 | INHALATION_SPRAY | Freq: Once | RESPIRATORY_TRACT | Status: DC | PRN

## 2019-06-05 NOTE — Discharge Instructions (Signed)

## 2019-06-05 NOTE — Progress Notes (Signed)
  I connected by phone with [name] on 06/05/2019 at 8:10 AM to discuss the potential use of an new treatment for mild to moderate COVID-19 viral infection in non-hospitalized patients.  This patient is a 64 y.o. female that meets the FDA criteria for Emergency Use Authorization of bamlanivimab or casirivimab\imdevimab.  Has a (+) direct SARS-CoV-2 viral test result  Has mild or moderate COVID-19   Is ? 65 years of age and weighs ? 40 kg  Is NOT hospitalized due to COVID-19  Is NOT requiring oxygen therapy or requiring an increase in baseline oxygen flow rate due to COVID-19  Is within 10 days of symptom onset  Has at least one of the high risk factor(s) for progression to severe COVID-19 and/or hospitalization as defined in EUA.  Specific high risk criteria : Hypertension   I have spoken and communicated the following to the patient or parent/caregiver:  1. FDA has authorized the emergency use of bamlanivimab and casirivimab\imdevimab for the treatment of mild to moderate COVID-19 in adults and pediatric patients with positive results of direct SARS-CoV-2 viral testing who are 67 years of age and older weighing at least 40 kg, and who are at high risk for progressing to severe COVID-19 and/or hospitalization.  2. The significant known and potential risks and benefits of bamlanivimab and casirivimab\imdevimab, and the extent to which such potential risks and benefits are unknown.  3. Information on available alternative treatments and the risks and benefits of those alternatives, including clinical trials.  4. Patients treated with bamlanivimab and casirivimab\imdevimab should continue to self-isolate and use infection control measures (e.g., wear mask, isolate, social distance, avoid sharing personal items, clean and disinfect "high touch" surfaces, and frequent handwashing) according to CDC guidelines.   5. The patient or parent/caregiver has the option to accept or refuse  bamlanivimab or casirivimab\imdevimab .  After reviewing this information with the patient, The patient agreed to proceed with receiving the bamlanimivab infusion and will be provided a copy of the Fact sheet prior to receiving the infusion.Debbie Barnett 06/05/2019 8:10 AM

## 2019-06-05 NOTE — Progress Notes (Signed)
  Diagnosis: COVID-19  Physician: Dr. Joya Gaskins  Procedure: Covid Infusion Clinic Med: bamlanivimab infusion - Provided patient with bamlanimivab fact sheet for patients, parents and caregivers prior to infusion.  Complications: No immediate complications noted.  Discharge: Discharged home   Debbie Barnett, Debbie Barnett 06/05/2019

## 2019-06-12 ENCOUNTER — Ambulatory Visit: Payer: BLUE CROSS/BLUE SHIELD | Admitting: Gastroenterology

## 2019-06-12 ENCOUNTER — Ambulatory Visit: Payer: BLUE CROSS/BLUE SHIELD | Admitting: Pharmacist

## 2019-06-24 MED FILL — SOFOSBUVIR-VELPATASVIR 400-: 400-100 | 28 days supply | Qty: 28 | Fill #2

## 2019-07-10 ENCOUNTER — Other Ambulatory Visit: Payer: Self-pay | Admitting: Critical Care Medicine

## 2019-08-15 NOTE — Telephone Encounter (Signed)
error 

## 2019-09-21 ENCOUNTER — Ambulatory Visit: Payer: BLUE CROSS/BLUE SHIELD | Attending: Internal Medicine

## 2019-09-21 DIAGNOSIS — Z23 Encounter for immunization: Secondary | ICD-10-CM

## 2019-09-21 NOTE — Progress Notes (Signed)
   Covid-19 Vaccination Clinic  Name:  Debbie Barnett    MRN: [id number] DOB: April 06, 1956  09/21/2019  [name] was observed post Covid-19 immunization for 15 minutes without incident. She was provided with Vaccine Information Sheet and instruction to access the V-Safe system.   [name] was instructed to call 911 with any severe reactions post vaccine: Marland Kitchen Difficulty breathing  . Swelling of face and throat  . A fast heartbeat  . A bad rash all over body  . Dizziness and weakness   Immunizations Administered    Name Date Dose VIS Date Route   Pfizer COVID-19 Vaccine 09/21/2019  8:07 AM 0.3 mL 07/10/2018 Intramuscular   Manufacturer: [place]   Lot: [id number]   [place]: [id number]

## 2019-10-12 ENCOUNTER — Other Ambulatory Visit: Payer: Self-pay | Admitting: Critical Care Medicine

## 2019-10-15 ENCOUNTER — Ambulatory Visit: Payer: BLUE CROSS/BLUE SHIELD | Attending: Internal Medicine

## 2019-10-15 DIAGNOSIS — Z23 Encounter for immunization: Secondary | ICD-10-CM

## 2019-10-15 NOTE — Progress Notes (Signed)
   Covid-19 Vaccination Clinic  Name:  Debbie Barnett    MRN: [id number] DOB: 1955/07/08  10/15/2019  [name] was observed post Covid-19 immunization for 15 minutes without incident. She was provided with Vaccine Information Sheet and instruction to access the V-Safe system.   [name] was instructed to call 911 with any severe reactions post vaccine: Marland Kitchen Difficulty breathing  . Swelling of face and throat  . A fast heartbeat  . A bad rash all over body  . Dizziness and weakness   Immunizations Administered    Name Date Dose VIS Date Route   Pfizer COVID-19 Vaccine 10/15/2019  8:19 AM 0.3 mL 07/10/2018 Intramuscular   Manufacturer: [place]   Lot: [id number]   [place]: [id number]

## 2019-10-25 ENCOUNTER — Other Ambulatory Visit: Payer: Self-pay | Admitting: Critical Care Medicine

## 2019-10-25 ENCOUNTER — Telehealth: Payer: Self-pay | Admitting: Critical Care Medicine

## 2019-10-25 MED ORDER — LISINOPRIL-HYDROCHLOROTHIAZIDE 20-25 MG PO TABS: 1.0000 | ORAL_TABLET | Freq: Every day | ORAL | 0 refills | Status: DC

## 2019-10-25 NOTE — Telephone Encounter (Signed)
Patient called in and requested for listed medication to be refilled and sent to [facility] ([place]), [place] - [address]  lisinopril-hydrochlorothiazide (ZESTORETIC) 20-25 MG tablet [[id number]]

## 2019-10-25 NOTE — Telephone Encounter (Signed)
Rx sent 

## 2019-11-14 ENCOUNTER — Other Ambulatory Visit: Payer: Self-pay | Admitting: Critical Care Medicine

## 2019-11-14 MED ORDER — LISINOPRIL-HYDROCHLOROTHIAZIDE 20-25 MG PO TABS: 1.0000 | ORAL_TABLET | Freq: Every day | ORAL | 0 refills | Status: DC

## 2019-11-14 NOTE — Telephone Encounter (Signed)
Requested Prescriptions  Pending Prescriptions Disp Refills  . lisinopril-hydrochlorothiazide (ZESTORETIC) 20-25 MG tablet 30 tablet 0    Sig: Take 1 tablet by mouth daily.     Cardiovascular:  ACEI + Diuretic Combos Failed - 11/14/2019  3:19 PM      Failed - Na in normal range and within 180 days    Sodium  Date Value Ref Range Status  01/17/2019 140 134 - 144 mmol/L Final         Failed - K in normal range and within 180 days    Potassium  Date Value Ref Range Status  01/17/2019 4.5 3.5 - 5.2 mmol/L Final         Failed - Cr in normal range and within 180 days    Creatinine, Ser  Date Value Ref Range Status  01/17/2019 0.67 0.57 - 1.00 mg/dL Final         Failed - Ca in normal range and within 180 days    Calcium  Date Value Ref Range Status  01/17/2019 9.4 8.7 - 10.3 mg/dL Final         Failed - Valid encounter within last 6 months    Recent Outpatient Visits          10 months ago Need for influenza vaccination   [place], Stephen L, RPH-CPP   10 months ago Essential hypertension   [place], MD      Future Appointments            In 1 month Elsie Stain, MD [place] - Patient is not pregnant      Passed - Last BP in normal range    BP Readings from Last 1 Encounters:  06/05/19 135/81

## 2019-11-14 NOTE — Telephone Encounter (Signed)
lisinopril-hydrochlorothiazide (ZESTORETIC) 20-25 MG tablet     Patient is requesting a refill. Patient was unable to schedule appointment until August as provider has no availability.    Pharmacy:  [facility] [place] Phone:  [phone]  Fax:  [phone]

## 2019-11-27 ENCOUNTER — Ambulatory Visit: Payer: BLUE CROSS/BLUE SHIELD | Admitting: Critical Care Medicine

## 2019-12-17 ENCOUNTER — Ambulatory Visit: Payer: BLUE CROSS/BLUE SHIELD | Admitting: Critical Care Medicine

## 2019-12-23 ENCOUNTER — Other Ambulatory Visit: Payer: Self-pay | Admitting: Critical Care Medicine

## 2019-12-23 NOTE — Telephone Encounter (Signed)
   Notes to clinic:  Request 90 Days supply going out of town until the end of September  Appointment was canceled on 12/17/2019 and no scheduled for 02/2020   Requested Prescriptions  Pending Prescriptions Disp Refills   lisinopril-hydrochlorothiazide (ZESTORETIC) 20-25 MG tablet 90 tablet 0    Sig: Take 1 tablet by mouth daily.      Cardiovascular:  ACEI + Diuretic Combos Failed - 12/23/2019  8:12 AM      Failed - Na in normal range and within 180 days    Sodium  Date Value Ref Range Status  01/17/2019 140 134 - 144 mmol/L Final          Failed - K in normal range and within 180 days    Potassium  Date Value Ref Range Status  01/17/2019 4.5 3.5 - 5.2 mmol/L Final          Failed - Cr in normal range and within 180 days    Creatinine, Ser  Date Value Ref Range Status  01/17/2019 0.67 0.57 - 1.00 mg/dL Final          Failed - Ca in normal range and within 180 days    Calcium  Date Value Ref Range Status  01/17/2019 9.4 8.7 - 10.3 mg/dL Final          Failed - Valid encounter within last 6 months    Recent Outpatient Visits           11 months ago Need for influenza vaccination   [place], Jarome Matin, RPH-CPP   11 months ago Essential hypertension   [place], MD       Future Appointments             In 1 month Elsie Stain, MD [place] - Patient is not pregnant      Passed - Last BP in normal range    BP Readings from Last 1 Encounters:  06/05/19 135/81

## 2019-12-23 NOTE — Telephone Encounter (Signed)
Copied from [place] [id number]. Topic: Quick Communication - Rx Refill/Question >> Dec 23, 2019  8:08 AM Debbie Barnett wrote: Medication: lisinopril-hydrochlorothiazide (ZESTORETIC) 20-25 MG tablet  Request 90 Days supply going out of town until the end of September  Has the patient contacted their pharmacy? No. (Agent: If no, request that the patient contact the pharmacy for the refill.) (Agent: If yes, when and what did the pharmacy advise?)  Preferred Pharmacy (with phone number or street name): [facility] [place]  Phone:  [phone] Fax:  [phone]     Agent: Please be advised that RX refills may take up to 3 business days. We ask that you follow-up with your pharmacy.

## 2019-12-25 MED ORDER — LISINOPRIL-HYDROCHLOROTHIAZIDE 20-25 MG PO TABS: 1.0000 | ORAL_TABLET | Freq: Every day | ORAL | 0 refills | Status: DC

## 2020-02-19 ENCOUNTER — Ambulatory Visit: Payer: BLUE CROSS/BLUE SHIELD | Admitting: Critical Care Medicine

## 2020-03-24 ENCOUNTER — Other Ambulatory Visit: Payer: Self-pay | Admitting: Critical Care Medicine

## 2020-03-24 MED ORDER — LISINOPRIL-HYDROCHLOROTHIAZIDE 20-25 MG PO TABS: 1.0000 | ORAL_TABLET | Freq: Every day | ORAL | 0 refills | Status: DC

## 2020-03-24 NOTE — Telephone Encounter (Signed)
Copied from [place] [id number]. Topic: Quick Communication - Rx Refill/Question >> Mar 24, 2020 11:24 AM Leward Quan A wrote: Medication: lisinopril-hydrochlorothiazide (ZESTORETIC) 20-25 MG tablet   Has the patient contacted their pharmacy? Yes.   (Agent: If no, request that the patient contact the pharmacy for the refill.) (Agent: If yes, when and what did the pharmacy advise?)  Preferred Pharmacy (with phone number or street name): [facility] [place] - [place]  Phone:  [phone] Fax:  [phone]     Agent: Please be advised that RX refills may take up to 3 business days. We ask that you follow-up with your pharmacy.

## 2020-04-07 ENCOUNTER — Other Ambulatory Visit: Payer: Self-pay

## 2020-04-07 ENCOUNTER — Ambulatory Visit: Payer: Medicare Other | Attending: Critical Care Medicine | Admitting: Critical Care Medicine

## 2020-04-07 ENCOUNTER — Encounter: Payer: BLUE CROSS/BLUE SHIELD | Admitting: Critical Care Medicine

## 2020-04-07 ENCOUNTER — Encounter: Payer: Self-pay | Admitting: Critical Care Medicine

## 2020-04-07 VITALS — BP 132/86 | HR 69 | Ht 66.34 in | Wt 165.4 lb

## 2020-04-07 DIAGNOSIS — R937 Abnormal findings on diagnostic imaging of other parts of musculoskeletal system: Secondary | ICD-10-CM

## 2020-04-07 DIAGNOSIS — Z1231 Encounter for screening mammogram for malignant neoplasm of breast: Secondary | ICD-10-CM | POA: Diagnosis not present

## 2020-04-07 DIAGNOSIS — B182 Chronic viral hepatitis C: Secondary | ICD-10-CM

## 2020-04-07 DIAGNOSIS — I10 Essential (primary) hypertension: Secondary | ICD-10-CM | POA: Diagnosis not present

## 2020-04-07 DIAGNOSIS — Z23 Encounter for immunization: Secondary | ICD-10-CM | POA: Diagnosis not present

## 2020-04-07 DIAGNOSIS — H04123 Dry eye syndrome of bilateral lacrimal glands: Secondary | ICD-10-CM

## 2020-04-07 DIAGNOSIS — Z0000 Encounter for general adult medical examination without abnormal findings: Secondary | ICD-10-CM

## 2020-04-07 MED ORDER — LISINOPRIL-HYDROCHLOROTHIAZIDE 20-25 MG PO TABS: 1.0000 | ORAL_TABLET | Freq: Every day | ORAL | 2 refills | Status: DC

## 2020-04-07 NOTE — Progress Notes (Signed)
Subjective:    Debbie Barnett is a 64 y.o. female who presents for a Welcome to Medicare exam.  64 y.o.F here to re establish  Hx HTN  Not seen since 01/2019 This patient just achieved her Medicare.  The patient does not have any complaints today except for very dry eyes.  She is here for her Medicare wellness introduction visit  She has several primary care gaps that need to be addressed including she needs a mammogram, colon cancer screening There is a history of hypertension and on arrival blood pressure is 132/86.  Patient is compliant with Zestoretic 20/25 daily and does need refills  Review of Systems Constitutional:   No  weight loss, night sweats,  Fevers, chills, fatigue, lassitude. DRY EYES HEENT:   No headaches,  Difficulty swallowing,  Tooth/dental problems,  Sore throat,                No sneezing, itching, ear ache, nasal congestion, post nasal drip,   CV:  No chest pain,  Orthopnea, PND, swelling in lower extremities, anasarca, dizziness, palpitations  GI  No heartburn, indigestion, abdominal pain, nausea, vomiting, diarrhea, change in bowel habits, loss of appetite  Resp: No shortness of breath with exertion or at rest.  No excess mucus, no productive cough,  No non-productive cough,  No coughing up of blood.  No change in color of mucus.  No wheezing.  No chest wall deformity  Skin: no rash or lesions.  GU: no dysuria, change in color of urine, no urgency or frequency.  No flank pain.  MS:  No joint pain or swelling.  No decreased range of motion.  No back pain.  Psych:  No change in mood or affect. No depression or anxiety.  No memory loss.         Objective:    Today's Vitals   04/07/20 0850  BP: 132/86  Pulse: 69  SpO2: 98%  Weight: 165 lb 6.4 oz (75 kg)  Height: 5' 6.34" (1.685 m)  Body mass index is 26.42 kg/m.  Medications Outpatient Encounter Medications as of 04/07/2020  Medication Sig   lisinopril-hydrochlorothiazide (ZESTORETIC) 20-25 MG  tablet Take 1 tablet by mouth daily.   [DISCONTINUED] lisinopril-hydrochlorothiazide (ZESTORETIC) 20-25 MG tablet Take 1 tablet by mouth daily.   [DISCONTINUED] Sofosbuvir-Velpatasvir (EPCLUSA) 400-100 MG TABS Take 1 tablet by mouth daily. Take 1 tablet by mouth daily. (Patient not taking: Reported on 04/07/2020)   No facility-administered encounter medications on file as of 04/07/2020.     History: Past Medical History:  Diagnosis Date   Blood transfusion without reported diagnosis    ~1996 with gastroenteritis/food poisoning   Chronic hepatitis C ([place]) 01/24/2019   Hypertension    Past Surgical History:  Procedure Laterality Date   ABDOMINAL HYSTERECTOMY      Family History  Problem Relation Age of Onset   Kidney disease Mother    Heart disease Father    Obesity Sister    Heart disease Brother    Hypertension Brother    Alcohol abuse Maternal Grandfather    Social History   Occupational History   Occupation: Engineer, site  Tobacco Use   Smoking status: Former Smoker    Packs/day: 1.00    Types: Cigarettes    Quit date: 2013    Years since quitting: 8.8   Smokeless tobacco: Never Used  Substance and Sexual Activity   Alcohol use: Yes    Comment: rare, approximately twice a year  Drug use: Not Currently    Types: Cocaine, Marijuana    Comment: remote IVDU once in late 1970s   Sexual activity: Yes    Birth control/protection: Condom    Tobacco Counseling Counseling given: Not Answered   Immunizations and Health Maintenance Immunization History  Administered Date(s) Administered   Influenza,inj,Quad PF,6+ Mos 01/17/2019, 04/07/2020   PFIZER SARS-COV-2 Vaccination 09/21/2019, 10/15/2019   Pneumococcal Conjugate-13 04/07/2020   Tdap 01/17/2019   Health Maintenance Due  Topic Date Due   MAMMOGRAM  Never done   Fecal DNA (Cologuard)  Never done   DEXA SCAN  Never done    Activities of Daily Living In your present state  of health, do you have any difficulty performing the following activities: 04/07/2020  Hearing? N  Vision? N  Difficulty concentrating or making decisions? N  Walking or climbing stairs? N  Dressing or bathing? N  Doing errands, shopping? N  Preparing Food and eating ? N  Using the Toilet? N  In the past six months, have you accidently leaked urine? N  Do you have problems with loss of bowel control? N  Managing your Medications? N  Managing your Finances? N  Housekeeping or managing your Housekeeping? N  Some recent data might be hidden    Physical Exam   Vitals:   04/07/20 0850  BP: 132/86  Pulse: 69  SpO2: 98%  Weight: 165 lb 6.4 oz (75 kg)  Height: 5' 6.34" (1.685 m)     No results found.  Advanced Directives: The patient does not have an advance care plan we will give her a copy to review      Assessment:    This is a routine wellness visit for this patient without exam  I did look in the patient's eyes because of the dry eyes and they appear to be normal   Vision/Hearing screen  Hearing Screening   Method: Audiometry   125Hz  250Hz  500Hz  1000Hz  2000Hz  3000Hz  4000Hz  6000Hz  8000Hz   Right ear:   25 25 25 25 25     Left ear:   40 40 40 40 40      Visual Acuity Screening   Right eye Left eye Both eyes  Without correction: 20/40 20/40 20/40   With correction: 20/20 20/20 20/20     Dietary issues and exercise activities discussed:      Depression Screen PHQ 2/9 Scores 04/07/2020 01/17/2019  PHQ - 2 Score 0 0  PHQ- 9 Score - 1     Fall Risk Fall Risk  04/07/2020  Falls in the past year? 0  Number falls in past yr: 0  Injury with Fall? 0    Cognitive Function: MMSE - Mini Mental State Exam 04/07/2020  Orientation to time 5  Orientation to Place 5  Registration 3  Attention/ Calculation 5  Recall 3  Language- name 2 objects 2  Language- repeat 1  Language- follow 3 step command 3  Language- read & follow direction 1  Write a sentence 1  Copy  design 1  Total score 30        Patient Care Team: Elsie Stain, MD as PCP - General (Pulmonary Disease)     Plan:    I personally reviewed all images and lab data in the [facility] system as well as any outside material available during this office visit and agree with the  radiology impressions.   HTN (hypertension) Hypertension well controlled we will continue Zestoretic at this time and obtain blood count metabolic  panel and lipid panel for screening  Chronic hepatitis C without hepatic coma (HCC) Check liver panel and note patient is off current treatment for hepatitis C per infectious disease   [place] was seen today for medicare wellness.  Diagnoses and all orders for this visit:  Encounter for screening mammogram for malignant neoplasm of breast -     MM DIGITAL SCREENING BILATERAL; Future  Abnormal bone density screening  Primary hypertension -     Comprehensive metabolic panel -     CBC with Differential/Platelet  Chronic hepatitis C without hepatic coma (HCC) -     Comprehensive metabolic panel  Dry eyes -     Ambulatory referral to Ophthalmology  Encounter for Medicare annual wellness exam -     Lipid panel  Need for immunization against influenza -     Flu Vaccine QUAD 36+ mos IM  Need for vaccination with 13-polyvalent pneumococcal conjugate vaccine -     Pneumococcal conjugate vaccine 13-valent  Other orders -     lisinopril-hydrochlorothiazide (ZESTORETIC) 20-25 MG tablet; Take 1 tablet by mouth daily.      I have personally reviewed and noted the following in the patients chart:    Medical and social history  Use of alcohol, tobacco or illicit drugs   Current medications and supplements  Functional ability and status  Nutritional status  Physical activity  Advanced directives  List of other physicians  Hospitalizations, surgeries, and ER visits in previous 12 months  Vitals  Screenings to include cognitive, depression, and  falls  Referrals and appointments  In addition, I have reviewed and discussed with patient certain preventive protocols, quality metrics, and best practice recommendations. A written personalized care plan for preventive services as well as general preventive health recommendations were provided to patient.     Asencion Noble, MD 04/07/2020

## 2020-04-07 NOTE — Assessment & Plan Note (Signed)
Check liver panel and note patient is off current treatment for hepatitis C per infectious disease

## 2020-04-07 NOTE — Assessment & Plan Note (Signed)
Hypertension well controlled we will continue Zestoretic at this time and obtain blood count metabolic panel and lipid panel for screening

## 2020-04-07 NOTE — Progress Notes (Deleted)
Subjective:    Patient ID: Debbie Barnett, female    DOB: [birth year], 64 y.o.   MRN: [id number]  01/17/19 This is a pleasant 64 year old female who presented to the emergency room recently with fatigue weakness right arm swelling and neck pain which appear to generate from 2 insect bites.  This occurred in August and she was treated in the emergency room at that time she was found to have elevated blood pressure and they recommended institution of lisinopril 10 mg daily and referred her to our clinic to establish.  Patient's not had primary care in several years.  She does have previous history of hysterectomy in the 1990s.  She has no real headache there is no chest pain there is no shortness of breath there is no swelling in the lower extremities as well.  The patient is a lifelong never smoker and does not use alcohol the patient works for the [employer]   04/07/2020  HTN (hypertension) Hypertension newly diagnosed but not yet well controlled on 10 mg daily lisinopril  Plan will be to increase to lisinopril HCT 20/25 mg daily  We will recheck a complete metabolic panel complete blood count thyroid panel lipid panel  A low-salt DASH diet was given to the patient for consideration and she was recommended to increase her daily exercise     Jawanda was seen today for hospitalization follow-up.  Diagnoses and all orders for this visit:  Essential hypertension -     Comprehensive metabolic panel -     CBC with Differential/Platelet; Future -     Lipid panel -     Thyroid Profile -     TSH -     CBC with Differential/Platelet  Colon cancer screening -     Fecal occult blood, imunochemical  Encounter for screening for HIV -     HIV Antibody (routine testing w rflx)  Breast cancer screening -     MM DIGITAL SCREENING BILATERAL; Future  Need for hepatitis C screening test -     Hepatitis C antibody  Other orders -     lisinopril-hydrochlorothiazide  (ZESTORETIC) 20-25 MG tablet; Take 1 tablet by mouth daily.  We also will order a mammogram, HIV screening, hepatitis C screening along with fecal occult blood for colon cancer screening  We also administered a flu vaccine today at this visit  We will have the patient establish and return in follow-up note she has had a hysterectomy therefore does not need a Pap smear any longer as it was a complete hysterectomy   Hypertension This is a new problem. The current episode started more than 1 month ago. The problem is unchanged. The problem is uncontrolled. Associated symptoms include headaches. Pertinent negatives include no anxiety, blurred vision, chest pain, malaise/fatigue, neck pain, orthopnea, palpitations, peripheral edema, PND, shortness of breath or sweats. There are no known risk factors for coronary artery disease. Past treatments include ACE inhibitors. The current treatment provides moderate improvement. Compliance problems include exercise and diet.  There is no history of angina, kidney disease, CAD/MI, CVA, heart failure, left ventricular hypertrophy, PVD or retinopathy. There is no history of chronic renal disease, coarctation of the aorta, hyperaldosteronism, hypercortisolism, hyperparathyroidism, a hypertension causing med, pheochromocytoma, renovascular disease, sleep apnea or a thyroid problem.   Past Medical History:  Diagnosis Date  . Blood transfusion without reported diagnosis    ~1996 with gastroenteritis/food poisoning  . Chronic hepatitis C ([place]) 01/24/2019  . Hypertension  Family History  Problem Relation Age of Onset  . Kidney disease Mother   . Heart disease Father   . Obesity Sister   . Heart disease Brother   . Hypertension Brother   . Alcohol abuse Maternal Grandfather      Social History   Socioeconomic History  . Marital status: Single    Spouse name: Not on file  . Number of children: Not on file  . Years of education: Not on file  . Highest  education level: Not on file  Occupational History  . Occupation: Engineer, site  Tobacco Use  . Smoking status: Never Smoker  . Smokeless tobacco: Never Used  Substance and Sexual Activity  . Alcohol use: Yes    Comment: rare, approximately twice a year  . Drug use: Not Currently    Types: Cocaine, Marijuana    Comment: remote IVDU once in late 1970s  . Sexual activity: Yes    Birth control/protection: Condom  Other Topics Concern  . Not on file  Social History Narrative  . Not on file   Social Determinants of Health   Financial Resource Strain:   . Difficulty of Paying Living Expenses: Not on file  Food Insecurity:   . Worried About Charity fundraiser in the Last Year: Not on file  . Ran Out of Food in the Last Year: Not on file  Transportation Needs:   . Lack of Transportation (Medical): Not on file  . Lack of Transportation (Non-Medical): Not on file  Physical Activity:   . Days of Exercise per Week: Not on file  . Minutes of Exercise per Session: Not on file  Stress:   . Feeling of Stress : Not on file  Social Connections:   . Frequency of Communication with Friends and Family: Not on file  . Frequency of Social Gatherings with Friends and Family: Not on file  . Attends Religious Services: Not on file  . Active Member of Clubs or Organizations: Not on file  . Attends Archivist Meetings: Not on file  . Marital Status: Not on file  Intimate Partner Violence:   . Fear of Current or Ex-Partner: Not on file  . Emotionally Abused: Not on file  . Physically Abused: Not on file  . Sexually Abused: Not on file     No Known Allergies   Outpatient Medications Prior to Visit  Medication Sig Dispense Refill  . lisinopril-hydrochlorothiazide (ZESTORETIC) 20-25 MG tablet Take 1 tablet by mouth daily. 15 tablet 0  . Sofosbuvir-Velpatasvir (EPCLUSA) 400-100 MG TABS Take 1 tablet by mouth daily. Take 1 tablet by mouth daily. 28 tablet 2   No  facility-administered medications prior to visit.      Review of Systems  Constitutional: Negative for malaise/fatigue.  Eyes: Negative for blurred vision.  Respiratory: Negative for shortness of breath.   Cardiovascular: Negative for chest pain, palpitations, orthopnea and PND.  Musculoskeletal: Negative for neck pain.  Neurological: Positive for headaches.       Objective:   Physical Exam There were no vitals filed for this visit.  Gen: Pleasant, well-nourished, in no distress,  normal affect  ENT: No lesions,  mouth clear,  oropharynx clear, no postnasal drip  Neck: No JVD, no TMG, no carotid bruits  Lungs: No use of accessory muscles, no dullness to percussion, clear without rales or rhonchi  Cardiovascular: RRR, heart sounds normal, no murmur or gallops, no peripheral edema  Abdomen: soft and NT, no HSM,  BS  normal  Musculoskeletal: No deformities, no cyanosis or clubbing  Neuro: alert, non focal  Skin: Warm, no lesions or rashes   BMP Latest Ref Rng & Units 01/17/2019 12/21/2018  Glucose 65 - 99 mg/dL 105(H) 93  BUN 8 - 27 mg/dL 17 9  Creatinine 0.57 - 1.00 mg/dL 0.67 0.66  BUN/Creat Ratio 12 - 28 25 -  Sodium 134 - 144 mmol/L 140 138  Potassium 3.5 - 5.2 mmol/L 4.5 4.3  Chloride 96 - 106 mmol/L 103 103  CO2 20 - 29 mmol/L 24 25  Calcium 8.7 - 10.3 mg/dL 9.4 8.8(L)   Hepatic Function Latest Ref Rng & Units 01/17/2019 12/21/2018  Total Protein 6.0 - 8.5 g/dL 6.7 7.3  Albumin 3.8 - 4.8 g/dL 4.3 3.8  AST 0 - 40 IU/L 33 67(H)  ALT 0 - 32 IU/L 34(H) 115(H)  Alk Phosphatase 39 - 117 IU/L 87 97  Total Bilirubin 0.0 - 1.2 mg/dL 0.3 0.5   CBC Latest Ref Rng & Units 01/17/2019 12/21/2018  WBC 3.4 - 10.8 x10E3/uL 6.1 6.8  Hemoglobin 11.1 - 15.9 g/dL 12.9 13.2  Hematocrit 34.0 - 46.6 % 37.9 41.7  Platelets 150 - 450 x10E3/uL 214 183        Assessment & Plan:  I personally reviewed all images and lab data in the [facility] system as well as any outside material available  during this office visit and agree with the  radiology impressions.   No problem-specific Assessment & Plan notes found for this encounter.   There are no diagnoses linked to this encounter.We also will order a mammogram, HIV screening, hepatitis C screening along with fecal occult blood for colon cancer screening  We also administered a flu vaccine today at this visit  We will have the patient establish and return in follow-up note she has had a hysterectomy therefore does not need a Pap smear any longer as it was a complete hysterectomy

## 2020-04-07 NOTE — Patient Instructions (Addendum)
Refills on your blood pressure medicine sent to your [facility] pharmacy  Referral to ophthalmology was made  Medicare part a and B does not cover a bone density scan so this was not ordered  We will check on the status of your Cologuard study  Labs today include metabolic panel blood counts  Vaccines today were given Prevnar 13 and flu vaccine  Obtain your Covid booster vaccine  Return to see Dr. Joya Gaskins 6 months    Fall Prevention in the Home, Adult Falls can cause injuries. They can happen to people of all ages. There are many things you can do to make your home safe and to help prevent falls. Ask for help when making these changes, if needed. What actions can I take to prevent falls? General Instructions  Use good lighting in all rooms. Replace any light bulbs that burn out.  Turn on the lights when you go into a dark area. Use night-lights.  Keep items that you use often in easy-to-reach places. Lower the shelves around your home if necessary.  Set up your furniture so you have a clear path. Avoid moving your furniture around.  Do not have throw rugs and other things on the floor that can make you trip.  Avoid walking on wet floors.  If any of your floors are uneven, fix them.  Add color or contrast paint or tape to clearly mark and help you see: ? Any grab bars or handrails. ? First and last steps of stairways. ? Where the edge of each step is.  If you use a stepladder: ? Make sure that it is fully opened. Do not climb a closed stepladder. ? Make sure that both sides of the stepladder are locked into place. ? Ask someone to hold the stepladder for you while you use it.  If there are any pets around you, be aware of where they are. What can I do in the bathroom?      Keep the floor dry. Clean up any water that spills onto the floor as soon as it happens.  Remove soap buildup in the tub or shower regularly.  Use non-skid mats or decals on the floor of the tub  or shower.  Attach bath mats securely with double-sided, non-slip rug tape.  If you need to sit down in the shower, use a plastic, non-slip stool.  Install grab bars by the toilet and in the tub and shower. Do not use towel bars as grab bars. What can I do in the bedroom?  Make sure that you have a light by your bed that is easy to reach.  Do not use any sheets or blankets that are too big for your bed. They should not hang down onto the floor.  Have a firm chair that has side arms. You can use this for support while you get dressed. What can I do in the kitchen?  Clean up any spills right away.  If you need to reach something above you, use a strong step stool that has a grab bar.  Keep electrical cords out of the way.  Do not use floor polish or wax that makes floors slippery. If you must use wax, use non-skid floor wax. What can I do with my stairs?  Do not leave any items on the stairs.  Make sure that you have a light switch at the top of the stairs and the bottom of the stairs. If you do not have them, ask someone to  add them for you.  Make sure that there are handrails on both sides of the stairs, and use them. Fix handrails that are broken or loose. Make sure that handrails are as long as the stairways.  Install non-slip stair treads on all stairs in your home.  Avoid having throw rugs at the top or bottom of the stairs. If you do have throw rugs, attach them to the floor with carpet tape.  Choose a carpet that does not hide the edge of the steps on the stairway.  Check any carpeting to make sure that it is firmly attached to the stairs. Fix any carpet that is loose or worn. What can I do on the outside of my home?  Use bright outdoor lighting.  Regularly fix the edges of walkways and driveways and fix any cracks.  Remove anything that might make you trip as you walk through a door, such as a raised step or threshold.  Trim any bushes or trees on the path to your  home.  Regularly check to see if handrails are loose or broken. Make sure that both sides of any steps have handrails.  Install guardrails along the edges of any raised decks and porches.  Clear walking paths of anything that might make someone trip, such as tools or rocks.  Have any leaves, snow, or ice cleared regularly.  Use sand or salt on walking paths during winter.  Clean up any spills in your garage right away. This includes grease or oil spills. What other actions can I take?  Wear shoes that: ? Have a low heel. Do not wear high heels. ? Have rubber bottoms. ? Are comfortable and fit you well. ? Are closed at the toe. Do not wear open-toe sandals.  Use tools that help you move around (mobility aids) if they are needed. These include: ? Canes. ? Walkers. ? Scooters. ? Crutches.  Review your medicines with your doctor. Some medicines can make you feel dizzy. This can increase your chance of falling. Ask your doctor what other things you can do to help prevent falls. Where to find more information  Centers for Disease Control and Prevention, [employer]: https://garcia.biz/  [employer] on Aging: BrainJudge.co.uk Contact a doctor if:  You are afraid of falling at home.  You feel weak, drowsy, or dizzy at home.  You fall at home. Summary  There are many simple things that you can do to make your home safe and to help prevent falls.  Ways to make your home safe include removing tripping hazards and installing grab bars in the bathroom.  Ask for help when making these changes in your home. This information is not intended to replace advice given to you by your health care provider. Make sure you discuss any questions you have with your health care provider. Document Revised: 08/23/2018 Document Reviewed: 12/15/2016 Elsevier Patient Education  2020 [place] Maintenance, Female Adopting a healthy lifestyle and getting preventive care are  important in promoting health and wellness. Ask your health care provider about:  The right schedule for you to have regular tests and exams.  Things you can do on your own to prevent diseases and keep yourself healthy. What should I know about diet, weight, and exercise? Eat a healthy diet   Eat a diet that includes plenty of vegetables, fruits, low-fat dairy products, and lean protein.  Do not eat a lot of foods that are high in solid fats, added sugars, or sodium.  Maintain a healthy weight Body mass index (BMI) is used to identify weight problems. It estimates body fat based on height and weight. Your health care provider can help determine your BMI and help you achieve or maintain a healthy weight. Get regular exercise Get regular exercise. This is one of the most important things you can do for your health. Most adults should:  Exercise for at least 150 minutes each week. The exercise should increase your heart rate and make you sweat (moderate-intensity exercise).  Do strengthening exercises at least twice a week. This is in addition to the moderate-intensity exercise.  Spend less time sitting. Even light physical activity can be beneficial. Watch cholesterol and blood lipids Have your blood tested for lipids and cholesterol at 65 years of age, then have this test every 5 years. Have your cholesterol levels checked more often if:  Your lipid or cholesterol levels are high.  You are older than 65 years of age.  You are at high risk for heart disease. What should I know about cancer screening? Depending on your health history and family history, you may need to have cancer screening at various ages. This may include screening for:  Breast cancer.  Cervical cancer.  Colorectal cancer.  Skin cancer.  Lung cancer. What should I know about heart disease, diabetes, and high blood pressure? Blood pressure and heart disease  High blood pressure causes heart disease and  increases the risk of stroke. This is more likely to develop in people who have high blood pressure readings, are of African descent, or are overweight.  Have your blood pressure checked: ? Every 3-5 years if you are 50-56 years of age. ? Every year if you are 39 years old or older. Diabetes Have regular diabetes screenings. This checks your fasting blood sugar level. Have the screening done:  Once every three years after age 39 if you are at a normal weight and have a low risk for diabetes.  More often and at a younger age if you are overweight or have a high risk for diabetes. What should I know about preventing infection? Hepatitis B If you have a higher risk for hepatitis B, you should be screened for this virus. Talk with your health care provider to find out if you are at risk for hepatitis B infection. Hepatitis C Testing is recommended for:  Everyone born from [birth year] through 1966.  Anyone with known risk factors for hepatitis C. Sexually transmitted infections (STIs)  Get screened for STIs, including gonorrhea and chlamydia, if: ? You are sexually active and are younger than 65 years of age. ? You are older than 65 years of age and your health care provider tells you that you are at risk for this type of infection. ? Your sexual activity has changed since you were last screened, and you are at increased risk for chlamydia or gonorrhea. Ask your health care provider if you are at risk.  Ask your health care provider about whether you are at high risk for HIV. Your health care provider may recommend a prescription medicine to help prevent HIV infection. If you choose to take medicine to prevent HIV, you should first get tested for HIV. You should then be tested every 3 months for as long as you are taking the medicine. Pregnancy  If you are about to stop having your period (premenopausal) and you may become pregnant, seek counseling before you get pregnant.  Take 400 to 800  micrograms (mcg) of folic  acid every day if you become pregnant.  Ask for birth control (contraception) if you want to prevent pregnancy. Osteoporosis and menopause Osteoporosis is a disease in which the bones lose minerals and strength with aging. This can result in bone fractures. If you are 42 years old or older, or if you are at risk for osteoporosis and fractures, ask your health care provider if you should:  Be screened for bone loss.  Take a calcium or vitamin D supplement to lower your risk of fractures.  Be given hormone replacement therapy (HRT) to treat symptoms of menopause. Follow these instructions at home: Lifestyle  Do not use any products that contain nicotine or tobacco, such as cigarettes, e-cigarettes, and chewing tobacco. If you need help quitting, ask your health care provider.  Do not use street drugs.  Do not share needles.  Ask your health care provider for help if you need support or information about quitting drugs. Alcohol use  Do not drink alcohol if: ? Your health care provider tells you not to drink. ? You are pregnant, may be pregnant, or are planning to become pregnant.  If you drink alcohol: ? Limit how much you use to 0-1 drink a day. ? Limit intake if you are breastfeeding.  Be aware of how much alcohol is in your drink. In the [place] one drink equals one 12 oz bottle of beer (355 mL), one 5 oz glass of wine (148 mL), or one 1 oz glass of hard liquor (44 mL). General instructions  Schedule regular health, dental, and eye exams.  Stay current with your vaccines.  Tell your health care provider if: ? You often feel depressed. ? You have ever been abused or do not feel safe at home. Summary  Adopting a healthy lifestyle and getting preventive care are important in promoting health and wellness.  Follow your health care provider's instructions about healthy diet, exercising, and getting tested or screened for diseases.  Follow your health  care provider's instructions on monitoring your cholesterol and blood pressure. This information is not intended to replace advice given to you by your health care provider. Make sure you discuss any questions you have with your health care provider. Document Revised: 04/25/2018 Document Reviewed: 04/25/2018 Elsevier Patient Education  [place] A mammogram is a low energy X-ray of the breasts that is done to check for abnormal changes. This procedure can screen for and detect any changes that may indicate breast cancer. Mammograms are regularly done on women. A man may have a mammogram if he has a lump or swelling in his breast. A mammogram can also identify other changes and variations in the breast, such as:  Inflammation of the breast tissue (mastitis).  An infected area that contains a collection of pus (abscess).  A fluid-filled sac (cyst).  Fibrocystic changes. This is when breast tissue becomes denser, which can make the tissue feel rope-like or uneven under the skin.  Tumors that are not cancerous (benign). Tell a health care provider:  About any allergies you have.  If you have breast implants.  If you have had previous breast disease, biopsy, or surgery.  If you are breastfeeding.  If you are younger than age 61.  If you have a family history of breast cancer.  Whether you are pregnant or may be pregnant. What are the risks? Generally, this is a safe procedure. However, problems may occur, including:  Exposure to radiation. Radiation levels are very low with  this test.  The results being misinterpreted.  The need for further tests.  The inability of the mammogram to detect certain cancers. What happens before the procedure?  Schedule your test about 1-2 weeks after your menstrual period if you are still menstruating. This is usually when your breasts are the least tender.  If you have had a mammogram done at a different facility in the  past, get the mammogram X-rays or have them sent to your current exam facility. The new and old images will be compared.  Wash your breasts and underarms on the day of the test.  Do not wear deodorants, perfumes, lotions, or powders anywhere on your body on the day of the test.  Remove any jewelry from your neck.  Wear clothes that you can change into and out of easily. What happens during the procedure?   You will undress from the waist up and put on a gown that opens in the front.  You will stand in front of the X-ray machine.  Each breast will be placed between two plastic or glass plates. The plates will compress your breast for a few seconds. Try to stay as relaxed as possible during the procedure. This does not cause any harm to your breasts and any discomfort you feel will be very brief.  X-rays will be taken from different angles of each breast. The procedure may vary among health care providers and hospitals. What happens after the procedure?  The mammogram will be examined by a specialist (radiologist).  You may need to repeat certain parts of the test, depending on the quality of the images. This is commonly done if the radiologist needs a better view of the breast tissue.  You may resume your normal activities.  It is up to you to get the results of your procedure. Ask your health care provider, or the department that is doing the procedure, when your results will be ready. Summary  A mammogram is a low energy X-ray of the breasts that is done to check for abnormal changes. A man may have a mammogram if he has a lump or swelling in his breast.  If you have had a mammogram done at a different facility in the past, get the mammogram X-rays or have them sent to your current exam facility in order to compare them.  Schedule your test about 1-2 weeks after your menstrual period if you are still menstruating.  For this test, each breast will be placed between two plastic or  glass plates. The plates will compress your breast for a few seconds.  Ask when your test results will be ready. Make sure you get your test results. This information is not intended to replace advice given to you by your health care provider. Make sure you discuss any questions you have with your health care provider. Document Revised: 12/21/2017 Document Reviewed: 12/21/2017 Elsevier Patient Education  [place].

## 2020-04-08 ENCOUNTER — Encounter: Payer: Self-pay | Admitting: Critical Care Medicine

## 2020-04-08 ENCOUNTER — Other Ambulatory Visit: Payer: Self-pay | Admitting: Critical Care Medicine

## 2020-04-08 DIAGNOSIS — E785 Hyperlipidemia, unspecified: Secondary | ICD-10-CM | POA: Insufficient documentation

## 2020-04-08 LAB — LIPID PANEL
Chol/HDL Ratio: 4.2 ratio (ref 0.0–4.4)
Cholesterol, Total: 226 mg/dL — ABNORMAL HIGH (ref 100–199)
HDL: 54 mg/dL (ref >39)
LDL Chol Calc (NIH): 151 mg/dL — ABNORMAL HIGH (ref 0–99)
Triglycerides: 120 mg/dL (ref 0–149)
VLDL Cholesterol Cal: 21 mg/dL (ref 5–40)

## 2020-04-08 LAB — COMPREHENSIVE METABOLIC PANEL
ALT: 20 IU/L (ref 0–32)
AST: 20 IU/L (ref 0–40)
Albumin/Globulin Ratio: 1.8 (ref 1.2–2.2)
Albumin: 4.6 g/dL (ref 3.8–4.8)
Alkaline Phosphatase: 75 IU/L (ref 44–121)
BUN/Creatinine Ratio: 19 (ref 12–28)
BUN: 15 mg/dL (ref 8–27)
Bilirubin Total: 0.4 mg/dL (ref 0.0–1.2)
CO2: 24 mmol/L (ref 20–29)
Calcium: 9.5 mg/dL (ref 8.7–10.3)
Chloride: 101 mmol/L (ref 96–106)
Creatinine, Ser: 0.77 mg/dL (ref 0.57–1.00)
GFR calc Af Amer: 94 mL/min/{1.73_m2} (ref >59)
GFR calc non Af Amer: 81 mL/min/{1.73_m2} (ref >59)
Globulin, Total: 2.6 g/dL (ref 1.5–4.5)
Glucose: 108 mg/dL — ABNORMAL HIGH (ref 65–99)
Potassium: 4.6 mmol/L (ref 3.5–5.2)
Sodium: 137 mmol/L (ref 134–144)
Total Protein: 7.2 g/dL (ref 6.0–8.5)

## 2020-04-08 LAB — CBC WITH DIFFERENTIAL/PLATELET
Basophils Absolute: 0.1 10*3/uL (ref 0.0–0.2)
Basos: 1 %
EOS (ABSOLUTE): 0.2 10*3/uL (ref 0.0–0.4)
Eos: 3 %
Hematocrit: 38.2 % (ref 34.0–46.6)
Hemoglobin: 13.1 g/dL (ref 11.1–15.9)
Immature Grans (Abs): 0 10*3/uL (ref 0.0–0.1)
Immature Granulocytes: 0 %
Lymphocytes Absolute: 2.3 10*3/uL (ref 0.7–3.1)
Lymphs: 38 %
MCH: 30 pg (ref 26.6–33.0)
MCHC: 34.3 g/dL (ref 31.5–35.7)
MCV: 88 fL (ref 79–97)
Monocytes Absolute: 0.6 10*3/uL (ref 0.1–0.9)
Monocytes: 9 %
Neutrophils Absolute: 2.9 10*3/uL (ref 1.4–7.0)
Neutrophils: 49 %
Platelets: 197 10*3/uL (ref 150–450)
RBC: 4.36 x10E6/uL (ref 3.77–5.28)
RDW: 13.1 % (ref 11.7–15.4)
WBC: 6.1 10*3/uL (ref 3.4–10.8)

## 2020-04-08 MED ORDER — ATORVASTATIN CALCIUM 20 MG PO TABS: 20.0000 mg | ORAL_TABLET | Freq: Every day | ORAL | 3 refills | Status: DC

## 2020-04-21 ENCOUNTER — Ambulatory Visit: Payer: Medicare Other | Attending: Internal Medicine

## 2020-04-21 DIAGNOSIS — Z23 Encounter for immunization: Secondary | ICD-10-CM

## 2020-04-21 NOTE — Progress Notes (Signed)
   Covid-[facility] Vaccination [facility]  Name:  [name]    MRN: [id number] DOB: 17-Oct-1955  04/21/2020  [name] was observed post Covid-19 immunization for 15 minutes without incident. She was provided with Vaccine Information Sheet and instruction to access the V-Safe system.   [name] was instructed to call 911 with any severe reactions post vaccine: Marland Kitchen Difficulty breathing  . Swelling of face and throat  . A fast heartbeat  . A bad rash all over body  . Dizziness and weakness   Immunizations Administered    Name Date Dose VIS Date Route   Pfizer COVID-19 Vaccine 04/21/2020  3:27 PM 0.3 mL 03/04/2020 Intramuscular   Manufacturer: [place]   Lot: [id number]   NDC: [address]-7

## 2020-10-14 ENCOUNTER — Other Ambulatory Visit: Payer: Self-pay

## 2020-10-14 ENCOUNTER — Ambulatory Visit
Admission: RE | Admit: 2020-10-14 | Discharge: 2020-10-14 | Disposition: A | Discharge status: Home / Self Care | Payer: Medicare Other | Source: Ambulatory Visit | Attending: Critical Care Medicine | Admitting: Critical Care Medicine

## 2020-10-14 DIAGNOSIS — Z1231 Encounter for screening mammogram for malignant neoplasm of breast: Secondary | ICD-10-CM | POA: Diagnosis not present

## 2020-10-22 ENCOUNTER — Telehealth: Payer: Self-pay

## 2020-10-22 NOTE — Telephone Encounter (Signed)
Patient name and DOB has been verified Patient was informed of lab results. Patient had no questions.  

## 2020-10-22 NOTE — Telephone Encounter (Signed)
-----   Message from Elsie Stain, MD sent at 10/19/2020  6:02 AM EDT ----- Let pt know mammogram NEG  recheck in one year

## 2020-11-12 ENCOUNTER — Ambulatory Visit: Payer: Medicare Other

## 2020-11-22 NOTE — Progress Notes (Deleted)
Subjective:    Debbie Barnett is a 65 y.o. female who presents for a Welcome to Medicare exam.  65 y.o.F here to re establish  Hx HTN  Not seen since 01/2019 This patient just achieved her Medicare.  The patient does not have any complaints today except for very dry eyes.  She is here for her Medicare wellness introduction visit  She has several primary care gaps that need to be addressed including she needs a mammogram, colon cancer screening There is a history of hypertension and on arrival blood pressure is 132/86.  Patient is compliant with Zestoretic 20/25 daily and does need refills  7/11  HTN (hypertension) Hypertension well controlled we will continue Zestoretic at this time and obtain blood count metabolic panel and lipid panel for screening  Chronic hepatitis C without hepatic coma (HCC) Check liver panel and note patient is off current treatment for hepatitis C per infectious disease   Alexandre was seen today for medicare wellness.  Diagnoses and all orders for this visit:  Encounter for screening mammogram for malignant neoplasm of breast -     MM DIGITAL SCREENING BILATERAL; Future  Abnormal bone density screening  Primary hypertension -     Comprehensive metabolic panel -     CBC with Differential/Platelet  Chronic hepatitis C without hepatic coma (HCC) -     Comprehensive metabolic panel  Dry eyes -     Ambulatory referral to Ophthalmology  Encounter for Medicare annual wellness exam -     Lipid panel  Need for immunization against influenza -     Flu Vaccine QUAD 36+ mos IM  Need for vaccination with 13-polyvalent pneumococcal conjugate vaccine -     Pneumococcal conjugate vaccine 13-valent  Other orders -     lisinopril-hydrochlorothiazide (ZESTORETIC) 20-25 MG tablet; Take 1 tablet by mouth daily.     se of alcohol, tobacco or illicit drugs  Current medications and supplements Functional ability and status Nutritional status Physical  activity Advanced directives List of other physicians Hospitalizations, surgeries, and ER visits in previous 12 months Vitals Screenings to include cognitive, depression, and falls Referrals and appointments  In addition, I have reviewed and discussed with patient certain preventive protocols, quality metrics, and best practice recommendations. A written personalized care plan for preventive services as well as general preventive health recommendations were provided to patient. Review of Systems Constitutional:   No  weight loss, night sweats,  Fevers, chills, fatigue, lassitude. DRY EYES HEENT:   No headaches,  Difficulty swallowing,  Tooth/dental problems,  Sore throat,                No sneezing, itching, ear ache, nasal congestion, post nasal drip,   CV:  No chest pain,  Orthopnea, PND, swelling in lower extremities, anasarca, dizziness, palpitations  GI  No heartburn, indigestion, abdominal pain, nausea, vomiting, diarrhea, change in bowel habits, loss of appetite  Resp: No shortness of breath with exertion or at rest.  No excess mucus, no productive cough,  No non-productive cough,  No coughing up of blood.  No change in color of mucus.  No wheezing.  No chest wall deformity  Skin: no rash or lesions.  GU: no dysuria, change in color of urine, no urgency or frequency.  No flank pain.  MS:  No joint pain or swelling.  No decreased range of motion.  No back pain.  Psych:  No change in mood or affect. No depression or anxiety.  No  memory loss.         Objective:    There were no vitals filed for this visit. There is no height or weight on file to calculate BMI.  Medications Outpatient Encounter Medications as of 11/23/2020  Medication Sig   atorvastatin (LIPITOR) 20 MG tablet Take 1 tablet (20 mg total) by mouth daily.   lisinopril-hydrochlorothiazide (ZESTORETIC) 20-25 MG tablet Take 1 tablet by mouth daily.   No facility-administered encounter medications on file as of  11/23/2020.     History: Past Medical History:  Diagnosis Date   Blood transfusion without reported diagnosis    ~1996 with gastroenteritis/food poisoning   Chronic hepatitis C ([place]) 01/24/2019   Hypertension    Past Surgical History:  Procedure Laterality Date   ABDOMINAL HYSTERECTOMY      Family History  Problem Relation Age of Onset   Kidney disease Mother    Heart disease Father    Obesity Sister    Heart disease Brother    Hypertension Brother    Alcohol abuse Maternal Grandfather    Social History   Occupational History   Occupation: Engineer, site  Tobacco Use   Smoking status: Former    Packs/day: 1.00    Pack years: 0.00    Types: Cigarettes    Quit date: 2013    Years since quitting: 9.5   Smokeless tobacco: Never  Substance and Sexual Activity   Alcohol use: Yes    Comment: rare, approximately twice a year   Drug use: Not Currently    Types: Cocaine, Marijuana    Comment: remote IVDU once in late 1970s   Sexual activity: Yes    Birth control/protection: Condom    Tobacco Counseling Counseling given: Not Answered   Immunizations and Health Maintenance Immunization History  Administered Date(s) Administered   Influenza,inj,Quad PF,6+ Mos 01/17/2019, 04/07/2020   PFIZER(Purple Top)SARS-COV-2 Vaccination 09/21/2019, 10/15/2019, 04/21/2020   Pneumococcal Conjugate-13 04/07/2020   Tdap 01/17/2019   Health Maintenance Due  Topic Date Due   Fecal DNA (Cologuard)  Never done   Zoster Vaccines- Shingrix (1 of 2) Never done   DEXA SCAN  Never done   COVID-19 Vaccine (4 - Booster for [place] series) 08/20/2020    Activities of Daily Living In your present state of health, do you have any difficulty performing the following activities: 04/07/2020  Hearing? N  Vision? N  Difficulty concentrating or making decisions? N  Walking or climbing stairs? N  Dressing or bathing? N  Doing errands, shopping? N  Preparing Food and eating ? N  Using the  Toilet? N  In the past six months, have you accidently leaked urine? N  Do you have problems with loss of bowel control? N  Managing your Medications? N  Managing your Finances? N  Housekeeping or managing your Housekeeping? N  Some recent data might be hidden    Physical Exam   There were no vitals filed for this visit.    No results found.  Advanced Directives: The patient does not have an advance care plan we will give her a copy to review      Assessment:    This is a routine wellness visit for this patient without exam  I did look in the patient's eyes because of the dry eyes and they appear to be normal   Vision/Hearing screen No results found.   Dietary issues and exercise activities discussed:      Depression Screen PHQ 2/9 Scores 04/07/2020 01/17/2019  PHQ -  2 Score 0 0  PHQ- 9 Score - 1     Fall Risk Fall Risk  04/07/2020  Falls in the past year? 0  Number falls in past yr: 0  Injury with Fall? 0    Cognitive Function: MMSE - Mini Mental State Exam 04/07/2020  Orientation to time 5  Orientation to Place 5  Registration 3  Attention/ Calculation 5  Recall 3  Language- name 2 objects 2  Language- repeat 1  Language- follow 3 step command 3  Language- read & follow direction 1  Write a sentence 1  Copy design 1  Total score 30        Patient Care Team: Elsie Stain, MD as PCP - General (Pulmonary Disease)     Plan:    I personally reviewed all images and lab data in the [facility] system as well as any outside material available during this office visit and agree with the  radiology impressions.   No problem-specific Assessment & Plan notes found for this encounter.   There are no diagnoses linked to this encounter.     I have personally reviewed and noted the following in the patient's chart:   Medical and social history Use of alcohol, tobacco or illicit drugs  Current medications and supplements Functional ability and  status Nutritional status Physical activity Advanced directives List of other physicians Hospitalizations, surgeries, and ER visits in previous 12 months Vitals Screenings to include cognitive, depression, and falls Referrals and appointments  In addition, I have reviewed and discussed with patient certain preventive protocols, quality metrics, and best practice recommendations. A written personalized care plan for preventive services as well as general preventive health recommendations were provided to patient.     Asencion Noble, MD 11/22/2020

## 2020-11-22 NOTE — Progress Notes (Signed)
Established Patient Office Visit  Subjective:  Patient ID: Debbie Barnett, female    DOB: June 25, 1955  Age: 65 y.o. MRN: [id number]  CC: HTN f/u  HPI Debbie Barnett presents for HTN f/u Patient presents in follow-up and is doing well overall.  Patient has no specific complaints except for a skin tag under the left eyelid.  Patient is exercising more and is following a healthy diet.  On arrival blood pressure is 121/78.  Patient is now off chronic hepatitis C medication and is resolved the disease state and is being monitored.  Patient is due colon cancer screening and a DEXA scan.  Patient's been adherent with her blood pressure medicine and atorvastatin.  She is following a healthier diet.   Past Medical History:  Diagnosis Date   Blood transfusion without reported diagnosis    ~1996 with gastroenteritis/food poisoning   Chronic hepatitis C ([place]) 01/24/2019   Hypertension     Past Surgical History:  Procedure Laterality Date   ABDOMINAL HYSTERECTOMY      Family History  Problem Relation Age of Onset   Kidney disease Mother    Heart disease Father    Obesity Sister    Heart disease Brother    Hypertension Brother    Alcohol abuse Maternal Grandfather     Social History   Socioeconomic History   Marital status: Single    Spouse name: Not on file   Number of children: Not on file   Years of education: Not on file   Highest education level: Not on file  Occupational History   Occupation: Engineer, site  Tobacco Use   Smoking status: Former    Packs/day: 1.00    Pack years: 0.00    Types: Cigarettes    Quit date: 2013    Years since quitting: 9.5   Smokeless tobacco: Never  Substance and Sexual Activity   Alcohol use: Yes    Comment: rare, approximately twice a year   Drug use: Not Currently    Types: Cocaine, Marijuana    Comment: remote IVDU once in late 1970s   Sexual activity: Yes    Birth control/protection: Condom  Other Topics Concern    Not on file  Social History Narrative   Not on file   Social Determinants of Health   Financial Resource Strain: Not on file  Food Insecurity: Not on file  Transportation Needs: Not on file  Physical Activity: Not on file  Stress: Not on file  Social Connections: Not on file  Intimate Partner Violence: Not on file    Outpatient Medications Prior to Visit  Medication Sig Dispense Refill   atorvastatin (LIPITOR) 20 MG tablet Take 1 tablet (20 mg total) by mouth daily. 90 tablet 3   lisinopril-hydrochlorothiazide (ZESTORETIC) 20-25 MG tablet Take 1 tablet by mouth daily. 90 tablet 2   No facility-administered medications prior to visit.    No Known Allergies  ROS Review of Systems    Objective:    Physical Exam Constitutional:      Appearance: Normal appearance. She is normal weight.  HENT:     Head: Normocephalic and atraumatic.     Nose: Nose normal.     Mouth/Throat:     Mouth: Mucous membranes are dry.     Pharynx: Oropharynx is clear.  Eyes:     Extraocular Movements: Extraocular movements intact.     Conjunctiva/sclera: Conjunctivae normal.     Pupils: Pupils are equal, round, and reactive to  light.  Cardiovascular:     Rate and Rhythm: Normal rate and regular rhythm.     Pulses: Normal pulses.     Heart sounds: Normal heart sounds.  Pulmonary:     Effort: Pulmonary effort is normal.     Breath sounds: Normal breath sounds.  Abdominal:     General: Bowel sounds are normal.     Palpations: Abdomen is soft.  Musculoskeletal:        General: Normal range of motion.     Cervical back: Normal range of motion and neck supple.  Skin:    General: Skin is warm.     Comments: Skin tag under the left eyelid  Neurological:     General: No focal deficit present.     Mental Status: She is alert. Mental status is at baseline.  Psychiatric:        Mood and Affect: Mood normal.        Behavior: Behavior normal.        Judgment: Judgment normal.    BP 121/78    Pulse 65   Ht 5\' 6"  (1.676 m)   Wt 166 lb (75.3 kg)   SpO2 97%   BMI 26.79 kg/m  Wt Readings from Last 3 Encounters:  11/23/20 166 lb (75.3 kg)  04/07/20 165 lb 6.4 oz (75 kg)  02/27/19 161 lb (73 kg)     Health Maintenance Due  Topic Date Due   Fecal DNA (Cologuard)  Never done   DEXA SCAN  Never done   COVID-19 Vaccine (4 - Booster for [employer] series) 07/20/2020    There are no preventive care reminders to display for this patient.  Lab Results  Component Value Date   TSH 2.430 01/17/2019   Lab Results  Component Value Date   WBC 6.1 04/07/2020   HGB 13.1 04/07/2020   HCT 38.2 04/07/2020   MCV 88 04/07/2020   PLT 197 04/07/2020   Lab Results  Component Value Date   NA 137 04/07/2020   K 4.6 04/07/2020   CO2 24 04/07/2020   GLUCOSE 108 (H) 04/07/2020   BUN 15 04/07/2020   CREATININE 0.77 04/07/2020   BILITOT 0.4 04/07/2020   ALKPHOS 75 04/07/2020   AST 20 04/07/2020   ALT 20 04/07/2020   PROT 7.2 04/07/2020   ALBUMIN 4.6 04/07/2020   CALCIUM 9.5 04/07/2020   ANIONGAP 10 12/21/2018   Lab Results  Component Value Date   CHOL 226 (H) 04/07/2020   Lab Results  Component Value Date   HDL 54 04/07/2020   Lab Results  Component Value Date   LDLCALC 151 (H) 04/07/2020   Lab Results  Component Value Date   TRIG 120 04/07/2020   Lab Results  Component Value Date   CHOLHDL 4.2 04/07/2020   No results found for: HGBA1C    Assessment & Plan:   Problem List Items Addressed This Visit       Cardiovascular and Mediastinum   HTN (hypertension)    Hypertension well controlled patient is at goal  No change in medications refills given       Relevant Medications   lisinopril-hydrochlorothiazide (ZESTORETIC) 20-25 MG tablet   atorvastatin (LIPITOR) 20 MG tablet     Digestive   Chronic hepatitis C without hepatic coma ([place])    Stable off therapy and monitoring per hepatitis C clinic         Other   Hyperlipidemia    Continue  atorvastatin  Relevant Medications   lisinopril-hydrochlorothiazide (ZESTORETIC) 20-25 MG tablet   atorvastatin (LIPITOR) 20 MG tablet   Other Visit Diagnoses     Vision blurred    -  Primary   Relevant Orders   Ambulatory referral to Ophthalmology   Skin tag       Relevant Orders   Ambulatory referral to Ophthalmology   Colon cancer screening       Relevant Orders   Cologuard   At risk for decreased bone density       Relevant Orders   DG Bone Density   Need for zoster vaccination       Relevant Orders   Varicella-zoster vaccine IM (Shingrix) (Completed)       Meds ordered this encounter  Medications   lisinopril-hydrochlorothiazide (ZESTORETIC) 20-25 MG tablet    Sig: Take 1 tablet by mouth daily.    Dispense:  90 tablet    Refill:  2   atorvastatin (LIPITOR) 20 MG tablet    Sig: Take 1 tablet (20 mg total) by mouth daily.    Dispense:  90 tablet    Refill:  3  We will schedule a DEXA scan I gave the patient the first of 2 Shingrix vaccines this visit Will order Cologuard testing for colon cancer screening Referral to ophthalmology for vision check and also to evaluate skin tag under the left eyelid  Follow-up: Return in about 4 months (around 03/26/2021).    Asencion Noble, MD

## 2020-11-23 ENCOUNTER — Encounter (INDEPENDENT_AMBULATORY_CARE_PROVIDER_SITE_OTHER): Payer: Self-pay

## 2020-11-23 ENCOUNTER — Encounter: Payer: Self-pay | Admitting: Critical Care Medicine

## 2020-11-23 ENCOUNTER — Other Ambulatory Visit: Payer: Self-pay

## 2020-11-23 ENCOUNTER — Ambulatory Visit: Payer: Medicare Other | Attending: Critical Care Medicine | Admitting: Critical Care Medicine

## 2020-11-23 VITALS — BP 121/78 | HR 65 | Ht 66.0 in | Wt 166.0 lb

## 2020-11-23 DIAGNOSIS — Z1211 Encounter for screening for malignant neoplasm of colon: Secondary | ICD-10-CM

## 2020-11-23 DIAGNOSIS — I10 Essential (primary) hypertension: Secondary | ICD-10-CM | POA: Diagnosis not present

## 2020-11-23 DIAGNOSIS — E782 Mixed hyperlipidemia: Secondary | ICD-10-CM

## 2020-11-23 DIAGNOSIS — B182 Chronic viral hepatitis C: Secondary | ICD-10-CM | POA: Diagnosis not present

## 2020-11-23 DIAGNOSIS — Z9189 Other specified personal risk factors, not elsewhere classified: Secondary | ICD-10-CM | POA: Diagnosis not present

## 2020-11-23 DIAGNOSIS — Z23 Encounter for immunization: Secondary | ICD-10-CM | POA: Diagnosis not present

## 2020-11-23 DIAGNOSIS — H538 Other visual disturbances: Secondary | ICD-10-CM | POA: Diagnosis not present

## 2020-11-23 DIAGNOSIS — L918 Other hypertrophic disorders of the skin: Secondary | ICD-10-CM | POA: Diagnosis not present

## 2020-11-23 MED ORDER — LISINOPRIL-HYDROCHLOROTHIAZIDE 20-25 MG PO TABS: 1.0000 | ORAL_TABLET | Freq: Every day | ORAL | 2 refills | Status: DC

## 2020-11-23 MED ORDER — ATORVASTATIN CALCIUM 20 MG PO TABS: 20.0000 mg | ORAL_TABLET | Freq: Every day | ORAL | 3 refills | Status: DC

## 2020-11-23 NOTE — Assessment & Plan Note (Signed)
Hypertension well controlled patient is at goal  No change in medications refills given

## 2020-11-23 NOTE — Progress Notes (Signed)
Skin tag near eye. Referral to eye doctor.

## 2020-11-23 NOTE — Assessment & Plan Note (Signed)
Continue atorvastatin

## 2020-11-23 NOTE — Patient Instructions (Signed)
A Cologuard kit will be reissued to you please fax it back to the company when you process we do not have records of having received the Cologuard previously  A Shingrix shingles vaccine was given this visit he will come back for a second in the series pharmacy will alert you  Please get your COVID booster shot but wait 2 weeks for that after having had the shingles vaccine today  Referral to Dr. Gillian Scarce of [facility] ophthalmology be made show him your skin tag on your eye when you go  A bone DEXA scan will be ordered for bone density screening  Refills on your medication sent to your [place] pharmacy  We discussed methods to reduce lower intestinal gas see attachment  Continue your healthy exercise program  Return to see Dr. Joya Gaskins 4 months

## 2020-11-23 NOTE — Assessment & Plan Note (Signed)
Stable off therapy and monitoring per hepatitis C clinic

## 2020-11-30 DIAGNOSIS — Z1211 Encounter for screening for malignant neoplasm of colon: Secondary | ICD-10-CM | POA: Diagnosis not present

## 2020-12-04 LAB — COLOGUARD: Cologuard: NEGATIVE

## 2020-12-11 ENCOUNTER — Telehealth: Payer: Self-pay

## 2020-12-11 NOTE — Telephone Encounter (Signed)
-----   Message from Elsie Stain, MD sent at 12/06/2020  1:16 PM EDT ----- Let ms Debbie Barnett know her cologuard was NEG  recheck 3 years

## 2020-12-11 NOTE — Telephone Encounter (Signed)
Patient was called and a voicemail was left informing patient to return phone call for lab results. 

## 2020-12-15 ENCOUNTER — Telehealth: Payer: Self-pay

## 2020-12-15 NOTE — Telephone Encounter (Signed)
Called pt unable to reach, left Vm to call back

## 2020-12-15 NOTE — Telephone Encounter (Signed)
Patient called back for the result of her Cologuard please call Ph#  [phone]

## 2020-12-15 NOTE — Telephone Encounter (Signed)
  As instructed Called pt made aware of MD Joya Gaskins results notes and instructions. Verbalized understanding of given message/instructions"Note ----- Message from Elsie Stain, MD sent at 12/06/2020  1:16 PM EDT ----- Let ms Lucianne Lei zee know her cologuard was NEG  recheck 3 years

## 2021-04-05 ENCOUNTER — Ambulatory Visit: Payer: Medicare Other | Admitting: Critical Care Medicine

## 2021-04-20 ENCOUNTER — Other Ambulatory Visit: Payer: Self-pay

## 2021-04-20 ENCOUNTER — Ambulatory Visit: Payer: Medicare Other | Attending: Critical Care Medicine | Admitting: Critical Care Medicine

## 2021-04-20 ENCOUNTER — Encounter: Payer: Self-pay | Admitting: Critical Care Medicine

## 2021-04-20 VITALS — BP 128/84 | HR 70 | Resp 16 | Wt 169.4 lb

## 2021-04-20 DIAGNOSIS — I10 Essential (primary) hypertension: Secondary | ICD-10-CM

## 2021-04-20 DIAGNOSIS — B182 Chronic viral hepatitis C: Secondary | ICD-10-CM | POA: Diagnosis not present

## 2021-04-20 DIAGNOSIS — Z135 Encounter for screening for eye and ear disorders: Secondary | ICD-10-CM

## 2021-04-20 DIAGNOSIS — E782 Mixed hyperlipidemia: Secondary | ICD-10-CM | POA: Diagnosis not present

## 2021-04-20 DIAGNOSIS — Z23 Encounter for immunization: Secondary | ICD-10-CM | POA: Diagnosis not present

## 2021-04-20 MED ORDER — ZOSTER VAC RECOMB ADJUVANTED 50 MCG/0.5ML IM SUSR: 0.5000 mL | Freq: Once | INTRAMUSCULAR | 0 refills | Status: AC

## 2021-04-20 MED ORDER — LISINOPRIL-HYDROCHLOROTHIAZIDE 20-25 MG PO TABS: 1.0000 | ORAL_TABLET | Freq: Every day | ORAL | 3 refills | Status: DC

## 2021-04-20 NOTE — Assessment & Plan Note (Signed)
Using diet control now and is off statins

## 2021-04-20 NOTE — Progress Notes (Signed)
Established Patient Office Visit  Subjective:  Patient ID: Debbie Barnett, female    DOB: [birth year]  Age: 65 y.o. MRN: [id number]  CC: Primary care follow-up visit:    HPI Debbie Barnett presents for primary care follow-up.  Patient is due a flu vaccine and pneumonia vaccine she agrees to receive both at this visit.  She would also like a referral back to ophthalmology for another eye exam.  Patient's continues to try to lose weight she is down to a BMI of 27.3.  She is interested in joining a fitness center and is can be looking at options that may be covered under her new Medicare advantage insurance plan.  On arrival blood pressure 128/84.  She is fully vaccinated against COVID.  She has no other real complaints at this time.  She does have an upcoming DEXA scan scheduled for January  The patient is due the second zoster and  Past Medical History:  Diagnosis Date   Blood transfusion without reported diagnosis    ~1996 with gastroenteritis/food poisoning   Chronic hepatitis C ([place]) 01/24/2019   Hypertension     Past Surgical History:  Procedure Laterality Date   ABDOMINAL HYSTERECTOMY      Family History  Problem Relation Age of Onset   Kidney disease Mother    Heart disease Father    Obesity Sister    Heart disease Brother    Hypertension Brother    Alcohol abuse Maternal Grandfather     Social History   Socioeconomic History   Marital status: Single    Spouse name: Not on file   Number of children: Not on file   Years of education: Not on file   Highest education level: Not on file  Occupational History   Occupation: Engineer, site  Tobacco Use   Smoking status: Former    Packs/day: 1.00    Types: Cigarettes    Quit date: 2013    Years since quitting: 9.9   Smokeless tobacco: Never  Substance and Sexual Activity   Alcohol use: Yes    Comment: rare, approximately twice a year   Drug use: Not Currently    Types: Cocaine, Marijuana     Comment: remote IVDU once in late 1970s   Sexual activity: Yes    Birth control/protection: Condom  Other Topics Concern   Not on file  Social History Narrative   Not on file   Social Determinants of Health   Financial Resource Strain: Not on file  Food Insecurity: Not on file  Transportation Needs: Not on file  Physical Activity: Not on file  Stress: Not on file  Social Connections: Not on file  Intimate Partner Violence: Not on file    Outpatient Medications Prior to Visit  Medication Sig Dispense Refill   lisinopril-hydrochlorothiazide (ZESTORETIC) 20-25 MG tablet Take 1 tablet by mouth daily. 90 tablet 2   atorvastatin (LIPITOR) 20 MG tablet Take 1 tablet (20 mg total) by mouth daily. (Patient not taking: Reported on 04/20/2021) 90 tablet 3   PFIZER-BIONT COVID-19 VAC-TRIS SUSP injection      No facility-administered medications prior to visit.    No Known Allergies  ROS Review of Systems  Constitutional: Negative.   HENT: Negative.  Negative for ear pain, postnasal drip, rhinorrhea, sinus pressure, sore throat, trouble swallowing and voice change.   Eyes: Negative.   Respiratory: Negative.  Negative for apnea, cough, choking, chest tightness, shortness of breath, wheezing and stridor.   Cardiovascular:  Negative.  Negative for chest pain, palpitations and leg swelling.  Gastrointestinal: Negative.  Negative for abdominal distention, abdominal pain, nausea and vomiting.  Genitourinary: Negative.   Musculoskeletal: Negative.  Negative for arthralgias and myalgias.  Skin: Negative.  Negative for rash.  Allergic/Immunologic: Negative.  Negative for environmental allergies and food allergies.  Neurological: Negative.  Negative for dizziness, syncope, weakness and headaches.  Hematological: Negative.  Negative for adenopathy. Does not bruise/bleed easily.  Psychiatric/Behavioral: Negative.  Negative for agitation and sleep disturbance. The patient is not nervous/anxious.       Objective:    Physical Exam Vitals reviewed.  Constitutional:      Appearance: Normal appearance. She is well-developed. She is not diaphoretic.  HENT:     Head: Normocephalic and atraumatic.     Nose: No nasal deformity, septal deviation, mucosal edema or rhinorrhea.     Right Sinus: No maxillary sinus tenderness or frontal sinus tenderness.     Left Sinus: No maxillary sinus tenderness or frontal sinus tenderness.     Mouth/Throat:     Pharynx: No oropharyngeal exudate.  Eyes:     General: No scleral icterus.    Conjunctiva/sclera: Conjunctivae normal.     Pupils: Pupils are equal, round, and reactive to light.  Neck:     Thyroid: No thyromegaly.     Vascular: No carotid bruit or JVD.     Trachea: Trachea normal. No tracheal tenderness or tracheal deviation.  Cardiovascular:     Rate and Rhythm: Normal rate and regular rhythm.     Chest Wall: PMI is not displaced.     Pulses: Normal pulses. No decreased pulses.     Heart sounds: Normal heart sounds, S1 normal and S2 normal. Heart sounds not distant. No murmur heard. No systolic murmur is present.  No diastolic murmur is present.    No friction rub. No gallop. No S3 or S4 sounds.  Pulmonary:     Effort: No tachypnea, accessory muscle usage or respiratory distress.     Breath sounds: No stridor. No decreased breath sounds, wheezing, rhonchi or rales.  Chest:     Chest wall: No tenderness.  Abdominal:     General: Bowel sounds are normal. There is no distension.     Palpations: Abdomen is soft. Abdomen is not rigid.     Tenderness: There is no abdominal tenderness. There is no guarding or rebound.  Musculoskeletal:        General: Normal range of motion.     Cervical back: Normal range of motion and neck supple. No edema, erythema or rigidity. No muscular tenderness. Normal range of motion.  Lymphadenopathy:     Head:     Right side of head: No submental or submandibular adenopathy.     Left side of head: No  submental or submandibular adenopathy.     Cervical: No cervical adenopathy.  Skin:    General: Skin is warm and dry.     Coloration: Skin is not pale.     Findings: No rash.     Nails: There is no clubbing.  Neurological:     Mental Status: She is alert and oriented to person, place, and time.     Sensory: No sensory deficit.  Psychiatric:        Mood and Affect: Mood normal.        Speech: Speech normal.        Behavior: Behavior normal.        Thought Content: Thought content normal.  BP 128/84   Pulse 70   Resp 16   Wt 169 lb 6.4 oz (76.8 kg)   SpO2 98%   BMI 27.34 kg/m  Wt Readings from Last 3 Encounters:  04/20/21 169 lb 6.4 oz (76.8 kg)  11/23/20 166 lb (75.3 kg)  04/07/20 165 lb 6.4 oz (75 kg)     Health Maintenance Due  Topic Date Due   DEXA SCAN  Never done   Zoster Vaccines- Shingrix (2 of 2) 01/18/2021   COVID-19 Vaccine (5 - Booster for Pfizer series) 01/31/2021    There are no preventive care reminders to display for this patient.  Lab Results  Component Value Date   TSH 2.430 01/17/2019   Lab Results  Component Value Date   WBC 6.1 04/07/2020   HGB 13.1 04/07/2020   HCT 38.2 04/07/2020   MCV 88 04/07/2020   PLT 197 04/07/2020   Lab Results  Component Value Date   NA 137 04/07/2020   K 4.6 04/07/2020   CO2 24 04/07/2020   GLUCOSE 108 (H) 04/07/2020   BUN 15 04/07/2020   CREATININE 0.77 04/07/2020   BILITOT 0.4 04/07/2020   ALKPHOS 75 04/07/2020   AST 20 04/07/2020   ALT 20 04/07/2020   PROT 7.2 04/07/2020   ALBUMIN 4.6 04/07/2020   CALCIUM 9.5 04/07/2020   ANIONGAP 10 12/21/2018   Lab Results  Component Value Date   CHOL 226 (H) 04/07/2020   Lab Results  Component Value Date   HDL 54 04/07/2020   Lab Results  Component Value Date   LDLCALC 151 (H) 04/07/2020   Lab Results  Component Value Date   TRIG 120 04/07/2020   Lab Results  Component Value Date   CHOLHDL 4.2 04/07/2020   No results found for: HGBA1C     Assessment & Plan:   Problem List Items Addressed This Visit       Cardiovascular and Mediastinum   HTN (hypertension)    Blood pressure well controlled at this time no change in lisinopril HCT refills given      Relevant Medications   lisinopril-hydrochlorothiazide (ZESTORETIC) 20-25 MG tablet     Digestive   Chronic hepatitis C without hepatic coma (HCC)    Stable off therapy being monitored in the hepatitis C clinic viral load is reduced to undetectable level      Relevant Medications   Zoster Vaccine Adjuvanted [facility]) injection     Other   Hyperlipidemia    Using diet control now and is off statins      Relevant Medications   lisinopril-hydrochlorothiazide (ZESTORETIC) 20-25 MG tablet   Other Visit Diagnoses     Screening for eye condition    -  Primary   Relevant Orders   Ambulatory referral to Ophthalmology   Need for immunization against influenza       Relevant Orders   Flu Vaccine QUAD 39mo+IM (Fluarix, Fluzone & Alfiuria Quad PF) (Completed)   Need for Streptococcus pneumoniae vaccination       Relevant Orders   Pneumococcal conjugate vaccine 20-valent (Completed)       Meds ordered this encounter  Medications   Zoster Vaccine Adjuvanted [facility]) injection    Sig: Inject 0.5 mLs into the muscle once for 1 dose.    Dispense:  0.5 mL    Refill:  0   lisinopril-hydrochlorothiazide (ZESTORETIC) 20-25 MG tablet    Sig: Take 1 tablet by mouth daily.    Dispense:  90 tablet    Refill:  3  Patient needs a second shingles vaccine will prescribe this for administration at a private pharmacy prescription given to the patient  Prevnar 20 and flu vaccine was given  Follow-up: Return in about 5 months (around 09/18/2021).    Asencion Noble, MD

## 2021-04-20 NOTE — Assessment & Plan Note (Signed)
Blood pressure well controlled at this time no change in lisinopril HCT refills given

## 2021-04-20 NOTE — Assessment & Plan Note (Addendum)
Stable off therapy being monitored in the hepatitis C clinic viral load is reduced to undetectable level

## 2021-04-20 NOTE — Patient Instructions (Signed)
Prevnar 20 pneumonia vaccine given this completes her series, flu vaccine given.  Based on her new policy you will have to go to a private pharmacy for your shingles vaccine.  I given you a printed prescription for this.  He simply take it to any pharmacy and they will give you the second shingles vaccine this will complete your series.  Refills on your blood pressure medicine sent to your [facility] pharmacy  No labs are needed today  Continue to follow a healthy diet and I do encourage you to join a fitness center.  Please give consideration to the [place] which is out on [place] [facility] [employer] at the [employer] your appointment for your DEXA scan upcoming  Referral to [facility] ophthalmology Dr. Prudencio Burly will be made for another eye exam  Return to see Dr. Joya Gaskins 5 months

## 2021-05-13 ENCOUNTER — Ambulatory Visit
Admission: RE | Admit: 2021-05-13 | Discharge: 2021-05-13 | Disposition: A | Discharge status: Home / Self Care | Payer: Medicare Other | Source: Ambulatory Visit | Attending: Critical Care Medicine | Admitting: Critical Care Medicine

## 2021-05-13 DIAGNOSIS — Z780 Asymptomatic menopausal state: Secondary | ICD-10-CM | POA: Diagnosis not present

## 2021-05-13 DIAGNOSIS — Z9189 Other specified personal risk factors, not elsewhere classified: Secondary | ICD-10-CM

## 2021-05-18 ENCOUNTER — Telehealth: Payer: Self-pay

## 2021-05-18 NOTE — Telephone Encounter (Signed)
Pt was called and is aware of results, DOB was confirmed.  ?

## 2021-05-18 NOTE — Telephone Encounter (Signed)
-----   Message from Elsie Stain, MD sent at 05/14/2021  2:10 PM EST ----- Let the pt know her Bone scan was NORMAL  rescan in two years   normal bone density it is ok to take a womens over 50 mutlivitamin that will have the correct amount of calcium and vit D she would need one daily

## 2021-05-20 ENCOUNTER — Other Ambulatory Visit: Payer: Medicare Other

## 2021-09-15 DIAGNOSIS — H04123 Dry eye syndrome of bilateral lacrimal glands: Secondary | ICD-10-CM | POA: Diagnosis not present

## 2021-09-15 DIAGNOSIS — H5203 Hypermetropia, bilateral: Secondary | ICD-10-CM | POA: Diagnosis not present

## 2021-09-18 NOTE — Progress Notes (Addendum)
? ?Established Patient Office Visit ? ?Subjective:  ?Patient ID: Debbie Barnett, female    DOB: [birth year]  Age: 66 y.o. MRN: [id number] ? ?CC: Primary care follow-up visit: ? ? ? ?HPI ?04/2021 ?[name] presents for primary care follow-up.  Patient is due a flu vaccine and pneumonia vaccine she agrees to receive both at this visit.  She would also like a referral back to ophthalmology for another eye exam. ? ?Patient's continues to try to lose weight she is down to a BMI of 27.3.  She is interested in joining a fitness center and is can be looking at options that may be covered under her new Medicare advantage insurance plan.  On arrival blood pressure 128/84.  She is fully vaccinated against COVID.  She has no other real complaints at this time.  She does have an upcoming DEXA scan scheduled for January ? ?The patient is due the second zoster and ? ?09/20/21 ?Patient returns in follow-up.  Since the last office visit she had a bone DEXA scan that was normal.  Blood pressure on arrival is good 128/85.  She maintains Zestoretic and needs refills.  She is trying to eat a healthier diet.  She does need a follow-up lipid panel and health screening lab work.  She has skin tags under the left eye and under the left breast she would like removed.  Patient does have severe dental disease with multiple dental caries and is planning dental visit soon.  Patient had a mammogram in December which showed some bone densities was hard to read but largely was negative for malignancy.  They are recommending repeat study in 1 year not 2 years.  Cologuard study in 2022 was negative repeat is going to be 2025. ? ?Patient does not have any other real complaints at this visit.  She is due a second shingles vaccine and agrees to receive it at our office. ? ?  ?Past Medical History:  ?Diagnosis Date  ? Blood transfusion without reported diagnosis   ? ~1996 with gastroenteritis/food poisoning  ? Chronic hepatitis C ([place]) 01/24/2019  ?  Chronic hepatitis C without hepatic coma ([place]) 01/18/2019  ? Hypertension   ? ? ?Past Surgical History:  ?Procedure Laterality Date  ? ABDOMINAL HYSTERECTOMY    ? ? ?Family History  ?Problem Relation Age of Onset  ? Kidney disease Mother   ? Heart disease Father   ? Obesity Sister   ? Heart disease Brother   ? Hypertension Brother   ? Alcohol abuse Maternal Grandfather   ? ? ?Social History  ? ?Socioeconomic History  ? Marital status: Single  ?  Spouse name: Not on file  ? Number of children: Not on file  ? Years of education: Not on file  ? Highest education level: Not on file  ?Occupational History  ? Occupation: Engineer, site  ?Tobacco Use  ? Smoking status: Former  ?  Packs/day: 1.00  ?  Types: Cigarettes  ?  Quit date: 2013  ?  Years since quitting: 10.3  ? Smokeless tobacco: Never  ?Substance and Sexual Activity  ? Alcohol use: Yes  ?  Comment: rare, approximately twice a year  ? Drug use: Not Currently  ?  Types: Cocaine, Marijuana  ?  Comment: remote IVDU once in late 1970s  ? Sexual activity: Yes  ?  Birth control/protection: Condom  ?Other Topics Concern  ? Not on file  ?Social History Narrative  ? Not on file  ? ?  Social Determinants of Health  ? ?Financial Resource Strain: Not on file  ?Food Insecurity: Not on file  ?Transportation Needs: Not on file  ?Physical Activity: Not on file  ?Stress: Not on file  ?Social Connections: Not on file  ?Intimate Partner Violence: Not on file  ? ? ?Outpatient Medications Prior to Visit  ?Medication Sig Dispense Refill  ? lisinopril-hydrochlorothiazide (ZESTORETIC) 20-25 MG tablet Take 1 tablet by mouth daily. 90 tablet 3  ? ?No facility-administered medications prior to visit.  ? ? ?No Known Allergies ? ?ROS ?Review of Systems  ?Constitutional: Negative.   ?HENT: Negative.  Negative for ear pain, postnasal drip, rhinorrhea, sinus pressure, sore throat, trouble swallowing and voice change.   ?Eyes: Negative.   ?Respiratory: Negative.  Negative for apnea, cough,  choking, chest tightness, shortness of breath, wheezing and stridor.   ?Cardiovascular: Negative.  Negative for chest pain, palpitations and leg swelling.  ?Gastrointestinal: Negative.  Negative for abdominal distention, abdominal pain, nausea and vomiting.  ?Genitourinary: Negative.   ?Musculoskeletal: Negative.  Negative for arthralgias and myalgias.  ?Skin: Negative.  Negative for rash.  ?Allergic/Immunologic: Negative.  Negative for environmental allergies and food allergies.  ?Neurological: Negative.  Negative for dizziness, syncope, weakness and headaches.  ?Hematological: Negative.  Negative for adenopathy. Does not bruise/bleed easily.  ?Psychiatric/Behavioral: Negative.  Negative for agitation and sleep disturbance. The patient is not nervous/anxious.   ? ?  ?Objective:  ?  ?Physical Exam ?Vitals reviewed.  ?Constitutional:   ?   Appearance: Normal appearance. She is well-developed. She is not diaphoretic.  ?HENT:  ?   Head: Normocephalic and atraumatic.  ?   Nose: No nasal deformity, septal deviation, mucosal edema or rhinorrhea.  ?   Right Sinus: No maxillary sinus tenderness or frontal sinus tenderness.  ?   Left Sinus: No maxillary sinus tenderness or frontal sinus tenderness.  ?   Mouth/Throat:  ?   Pharynx: No oropharyngeal exudate.  ?   Comments: Multiple dental caries seen ?Eyes:  ?   General: No scleral icterus. ?   Conjunctiva/sclera: Conjunctivae normal.  ?   Pupils: Pupils are equal, round, and reactive to light.  ?Neck:  ?   Thyroid: No thyromegaly.  ?   Vascular: No carotid bruit or JVD.  ?   Trachea: Trachea normal. No tracheal tenderness or tracheal deviation.  ?Cardiovascular:  ?   Rate and Rhythm: Normal rate and regular rhythm.  ?   Chest Wall: PMI is not displaced.  ?   Pulses: Normal pulses. No decreased pulses.  ?   Heart sounds: Normal heart sounds, S1 normal and S2 normal. Heart sounds not distant. No murmur heard. ?No systolic murmur is present.  ?No diastolic murmur is present.  ?   No friction rub. No gallop. No S3 or S4 sounds.  ?Pulmonary:  ?   Effort: No tachypnea, accessory muscle usage or respiratory distress.  ?   Breath sounds: No stridor. No decreased breath sounds, wheezing, rhonchi or rales.  ?Chest:  ?   Chest wall: No tenderness.  ?Abdominal:  ?   General: Bowel sounds are normal. There is no distension.  ?   Palpations: Abdomen is soft. Abdomen is not rigid.  ?   Tenderness: There is no abdominal tenderness. There is no guarding or rebound.  ?Musculoskeletal:     ?   General: Normal range of motion.  ?   Cervical back: Normal range of motion and neck supple. No edema, erythema or rigidity. No muscular tenderness.  Normal range of motion.  ?Lymphadenopathy:  ?   Head:  ?   Right side of head: No submental or submandibular adenopathy.  ?   Left side of head: No submental or submandibular adenopathy.  ?   Cervical: No cervical adenopathy.  ?Skin: ?   General: Skin is warm and dry.  ?   Coloration: Skin is not pale.  ?   Findings: Lesion present. No rash.  ?   Nails: There is no clubbing.  ?   Comments: Small skin tag under the left eye and under the left breast appear benign  ?Neurological:  ?   Mental Status: She is alert and oriented to person, place, and time.  ?   Sensory: No sensory deficit.  ?Psychiatric:     ?   Mood and Affect: Mood normal.     ?   Speech: Speech normal.     ?   Behavior: Behavior normal.     ?   Thought Content: Thought content normal.  ? ? ?BP 128/85   Pulse 66   Wt 164 lb 3.2 oz (74.5 kg)   SpO2 96%   BMI 26.50 kg/m?  ?Wt Readings from Last 3 Encounters:  ?09/20/21 164 lb 3.2 oz (74.5 kg)  ?04/20/21 169 lb 6.4 oz (76.8 kg)  ?11/23/20 166 lb (75.3 kg)  ? ? ? ?There are no preventive care reminders to display for this patient. ? ? ?There are no preventive care reminders to display for this patient. ? ?Lab Results  ?Component Value Date  ? TSH 2.430 01/17/2019  ? ?Lab Results  ?Component Value Date  ? WBC 6.1 04/07/2020  ? HGB 13.1 04/07/2020  ? HCT 38.2  04/07/2020  ? MCV 88 04/07/2020  ? PLT 197 04/07/2020  ? ?Lab Results  ?Component Value Date  ? NA 137 04/07/2020  ? K 4.6 04/07/2020  ? CO2 24 04/07/2020  ? GLUCOSE 108 (H) 04/07/2020  ? BUN 15 04/07/2020

## 2021-09-20 ENCOUNTER — Encounter: Payer: Self-pay | Admitting: Critical Care Medicine

## 2021-09-20 ENCOUNTER — Ambulatory Visit: Payer: Medicare Other | Attending: Critical Care Medicine | Admitting: Critical Care Medicine

## 2021-09-20 VITALS — BP 128/85 | HR 66 | Wt 164.2 lb

## 2021-09-20 DIAGNOSIS — K029 Dental caries, unspecified: Secondary | ICD-10-CM | POA: Diagnosis not present

## 2021-09-20 DIAGNOSIS — B182 Chronic viral hepatitis C: Secondary | ICD-10-CM

## 2021-09-20 DIAGNOSIS — L918 Other hypertrophic disorders of the skin: Secondary | ICD-10-CM | POA: Insufficient documentation

## 2021-09-20 DIAGNOSIS — Z23 Encounter for immunization: Secondary | ICD-10-CM | POA: Insufficient documentation

## 2021-09-20 DIAGNOSIS — E782 Mixed hyperlipidemia: Secondary | ICD-10-CM | POA: Diagnosis not present

## 2021-09-20 DIAGNOSIS — Z139 Encounter for screening, unspecified: Secondary | ICD-10-CM | POA: Diagnosis not present

## 2021-09-20 DIAGNOSIS — I10 Essential (primary) hypertension: Secondary | ICD-10-CM

## 2021-09-20 DIAGNOSIS — Z1231 Encounter for screening mammogram for malignant neoplasm of breast: Secondary | ICD-10-CM | POA: Diagnosis not present

## 2021-09-20 MED ORDER — LISINOPRIL-HYDROCHLOROTHIAZIDE 20-25 MG PO TABS: 1.0000 | ORAL_TABLET | Freq: Every day | ORAL | 3 refills | Status: DC

## 2021-09-20 NOTE — Assessment & Plan Note (Signed)
Chronic hepatitis due to hepatitis C has resolved we will follow-up metabolic panel ?

## 2021-09-20 NOTE — Assessment & Plan Note (Signed)
We will assess lipid panel at this visit it was elevated and last checked in 2021 ?

## 2021-09-20 NOTE — Assessment & Plan Note (Signed)
Will need repeat mammogram December 2023 ?

## 2021-09-20 NOTE — Assessment & Plan Note (Signed)
Blood pressure well controlled asked patient to follow a lifestyle medicine diet that is mostly plant-based she will take this under advisement and will refill Zestoretic ?

## 2021-09-20 NOTE — Assessment & Plan Note (Signed)
We will give patient her second shingles vaccine this visit ?

## 2021-09-20 NOTE — Assessment & Plan Note (Signed)
Benign skin tags under left thigh and left breast will refer to dermatology for removal ?

## 2021-09-20 NOTE — Patient Instructions (Signed)
Shingles vaccine was given this complete your series ? ?Please get a dental exam to have the dental caries resolved ? ?Complete set of screening labs will be obtained today ? ?Referral to dermatology be made for the skin tags on your face and chest ? ?No change in blood pressure medications refill sent to your pharmacy ? ?Follow a healthy diet as prescribed per the lifestyle medicine handout we gave you for your increased cholesterol and blood pressure ? ?Recall that your bone density scan was normal ? ?You will need another mammogram obtained this summer I will go ahead and schedule it for the future ? ?Return to Dr. Joya Gaskins 5 months ? ? ?

## 2021-09-20 NOTE — Assessment & Plan Note (Signed)
Severe dental caries patient will follow through with dental exam ?

## 2021-09-21 ENCOUNTER — Other Ambulatory Visit: Payer: Self-pay | Admitting: Critical Care Medicine

## 2021-09-21 DIAGNOSIS — R7303 Prediabetes: Secondary | ICD-10-CM | POA: Insufficient documentation

## 2021-09-21 LAB — CBC WITH DIFFERENTIAL/PLATELET
Basophils Absolute: 0.1 10*3/uL (ref 0.0–0.2)
Basos: 1 %
EOS (ABSOLUTE): 0.2 10*3/uL (ref 0.0–0.4)
Eos: 2 %
Hematocrit: 38.3 % (ref 34.0–46.6)
Hemoglobin: 12.7 g/dL (ref 11.1–15.9)
Immature Grans (Abs): 0 10*3/uL (ref 0.0–0.1)
Immature Granulocytes: 0 %
Lymphocytes Absolute: 2.4 10*3/uL (ref 0.7–3.1)
Lymphs: 38 %
MCH: 29 pg (ref 26.6–33.0)
MCHC: 33.2 g/dL (ref 31.5–35.7)
MCV: 87 fL (ref 79–97)
Monocytes Absolute: 0.7 10*3/uL (ref 0.1–0.9)
Monocytes: 11 %
Neutrophils Absolute: 2.9 10*3/uL (ref 1.4–7.0)
Neutrophils: 48 %
Platelets: 207 10*3/uL (ref 150–450)
RBC: 4.38 x10E6/uL (ref 3.77–5.28)
RDW: 13.7 % (ref 11.7–15.4)
WBC: 6.2 10*3/uL (ref 3.4–10.8)

## 2021-09-21 LAB — COMPREHENSIVE METABOLIC PANEL
ALT: 32 IU/L (ref 0–32)
AST: 28 IU/L (ref 0–40)
Albumin/Globulin Ratio: 1.6 (ref 1.2–2.2)
Albumin: 4.7 g/dL (ref 3.8–4.8)
Alkaline Phosphatase: 75 IU/L (ref 44–121)
BUN/Creatinine Ratio: 23 (ref 12–28)
BUN: 20 mg/dL (ref 8–27)
Bilirubin Total: 0.3 mg/dL (ref 0.0–1.2)
CO2: 22 mmol/L (ref 20–29)
Calcium: 9.9 mg/dL (ref 8.7–10.3)
Chloride: 102 mmol/L (ref 96–106)
Creatinine, Ser: 0.86 mg/dL (ref 0.57–1.00)
Globulin, Total: 2.9 g/dL (ref 1.5–4.5)
Glucose: 90 mg/dL (ref 70–99)
Potassium: 4.5 mmol/L (ref 3.5–5.2)
Sodium: 140 mmol/L (ref 134–144)
Total Protein: 7.6 g/dL (ref 6.0–8.5)
eGFR: 74 mL/min/{1.73_m2} (ref >59)

## 2021-09-21 LAB — LIPID PANEL
Chol/HDL Ratio: 4.2 ratio (ref 0.0–4.4)
Cholesterol, Total: 237 mg/dL — ABNORMAL HIGH (ref 100–199)
HDL: 57 mg/dL (ref >39)
LDL Chol Calc (NIH): 153 mg/dL — ABNORMAL HIGH (ref 0–99)
Triglycerides: 148 mg/dL (ref 0–149)
VLDL Cholesterol Cal: 27 mg/dL (ref 5–40)

## 2021-09-21 LAB — HEMOGLOBIN A1C
Est. average glucose Bld gHb Est-mCnc: 131 mg/dL
Hgb A1c MFr Bld: 6.2 % — ABNORMAL HIGH (ref 4.8–5.6)

## 2021-09-21 MED ORDER — METFORMIN HCL ER 500 MG PO TB24: 500.0000 mg | ORAL_TABLET | Freq: Every day | ORAL | 2 refills | Status: DC

## 2021-09-21 MED ORDER — ATORVASTATIN CALCIUM 10 MG PO TABS: 10.0000 mg | ORAL_TABLET | Freq: Every day | ORAL | 3 refills | Status: DC

## 2021-11-01 ENCOUNTER — Ambulatory Visit
Admission: RE | Admit: 2021-11-01 | Discharge: 2021-11-01 | Disposition: A | Discharge status: Home / Self Care | Payer: Medicare Other | Source: Ambulatory Visit | Attending: Critical Care Medicine | Admitting: Critical Care Medicine

## 2021-11-01 DIAGNOSIS — D2372 Other benign neoplasm of skin of left lower limb, including hip: Secondary | ICD-10-CM | POA: Diagnosis not present

## 2021-11-01 DIAGNOSIS — L821 Other seborrheic keratosis: Secondary | ICD-10-CM | POA: Diagnosis not present

## 2021-11-01 DIAGNOSIS — L918 Other hypertrophic disorders of the skin: Secondary | ICD-10-CM | POA: Diagnosis not present

## 2021-11-01 DIAGNOSIS — D225 Melanocytic nevi of trunk: Secondary | ICD-10-CM | POA: Diagnosis not present

## 2021-11-01 DIAGNOSIS — D224 Melanocytic nevi of scalp and neck: Secondary | ICD-10-CM | POA: Diagnosis not present

## 2021-11-01 DIAGNOSIS — L720 Epidermal cyst: Secondary | ICD-10-CM | POA: Diagnosis not present

## 2021-11-01 DIAGNOSIS — L814 Other melanin hyperpigmentation: Secondary | ICD-10-CM | POA: Diagnosis not present

## 2021-11-01 DIAGNOSIS — Z1231 Encounter for screening mammogram for malignant neoplasm of breast: Secondary | ICD-10-CM | POA: Diagnosis not present

## 2021-11-01 DIAGNOSIS — L820 Inflamed seborrheic keratosis: Secondary | ICD-10-CM | POA: Diagnosis not present

## 2021-11-02 NOTE — Progress Notes (Signed)
Let patient know her mammogram was negative recheck in a year

## 2021-11-03 ENCOUNTER — Telehealth: Payer: Self-pay

## 2021-11-03 NOTE — Telephone Encounter (Signed)
-----   Message from Elsie Stain, MD sent at 11/02/2021  7:54 PM EDT ----- Let patient know her mammogram was negative recheck in a year

## 2021-11-03 NOTE — Telephone Encounter (Signed)
Pt was called and is aware of results, DOB was confirmed.  ?

## 2022-01-31 ENCOUNTER — Ambulatory Visit: Payer: Self-pay

## 2022-01-31 NOTE — Telephone Encounter (Signed)
  Chief Complaint: Cold Symptoms: Runny nose, cough Frequency: Weds night Pertinent Negatives: Patient denies fever, COVID -  Disposition: '[]'$ ED /'[]'$ Urgent Care (no appt availability in office) / '[]'$ Appointment(In office/virtual)/ '[]'$  [facility]/ '[x]'$ Home Care/ '[]'$ Refused Recommended Disposition /'[]'$ [facility]/ '[]'$  Follow-up with PCP Additional Notes: Pt has s/s of a cold, cough, runny nose. PT states she is feeling much better ad will continue with nightQuil and dayquil, tea and rest.   Summary: cold symptoms   Pt asked to speak with a nurse about her cold symptoms / se was around someone last week with covid / they are subsiding and her last home covid test were negative and she thinks it was just a bad cold / please advise / wanted to see if Dr. Joya Gaskins had any opinion on what to do      Reason for Disposition  Common cold with no complications  Answer Assessment - Initial Assessment Questions 1. ONSET: "When did the nasal discharge start?"      Weds night tickle in throat 2. AMOUNT: "How much discharge is there?"       3. COUGH: "Do you have a cough?" If Yes, ask: "Describe the color of your sputum" (clear, white, yellow, green)     Yes - none 4. RESPIRATORY DISTRESS: "Describe your breathing."       5. FEVER: "Do you have a fever?" If Yes, ask: "What is your temperature, how was it measured, and when did it start?"     no 6. SEVERITY: "Overall, how bad are you feeling right now?" (e.g., doesn't interfere with normal activities, staying home from school/work, staying in bed)      Ok 7. OTHER SYMPTOMS: "Do you have any other symptoms?" (e.g., sore throat, earache, wheezing, vomiting)     Runny nose 8. PREGNANCY: "Is there any chance you are pregnant?" "When was your last menstrual period?"     na  Protocols used: Common Cold-A-AH

## 2022-02-21 NOTE — Progress Notes (Signed)
Established Patient Office Visit  Subjective:  Patient ID: Debbie Barnett, female    DOB: 01/11/1956  Age: 66 y.o. MRN: [id number]  CC: Primary care follow-up visit:    HPI 04/2021 Debbie Barnett presents for primary care follow-up.  Patient is due a flu vaccine and pneumonia vaccine she agrees to receive both at this visit.  She would also like a referral back to ophthalmology for another eye exam.  Patient's continues to try to lose weight she is down to a BMI of 27.3.  She is interested in joining a fitness center and is can be looking at options that may be covered under her new Medicare advantage insurance plan.  On arrival blood pressure 128/84.  She is fully vaccinated against COVID.  She has no other real complaints at this time.  She does have an upcoming DEXA scan scheduled for January  The patient is due the second zoster and  09/20/21 Patient returns in follow-up.  Since the last office visit she had a bone DEXA scan that was normal.  Blood pressure on arrival is good 128/85.  She maintains Zestoretic and needs refills.  She is trying to eat a healthier diet.  She does need a follow-up lipid panel and health screening lab work.  She has skin tags under the left eye and under the left breast she would like removed.  Patient does have severe dental disease with multiple dental caries and is planning dental visit soon.  Patient had a mammogram in December which showed some bone densities was hard to read but largely was negative for malignancy.  They are recommending repeat study in 1 year not 2 years.  Cologuard study in 2022 was negative repeat is going to be 2025.  Patient does not have any other real complaints at this visit.  She is due a second shingles vaccine and agrees to receive it at our office.  10/10 Patient seen in follow-up she has lost 30 pounds in weight following a lifestyle medicine approach.  She is off all sugary substances and eating a plant-based diet.  Blood  pressure on arrival 115/75.  This is a dramatic improvement.  She is on low-dose lisinopril HCTZ.  Also has hyperlipidemia we will need to follow-up lipid panel.  She has a follow-up mammogram for December of this year.  She has no other primary care gaps and she did agree to receive the flu vaccine this visit  Past Medical History:  Diagnosis Date   Blood transfusion without reported diagnosis    ~1996 with gastroenteritis/food poisoning   Chronic hepatitis C ([place]) 01/24/2019   Chronic hepatitis C without hepatic coma ([place]) 01/18/2019   Hypertension     Past Surgical History:  Procedure Laterality Date   ABDOMINAL HYSTERECTOMY      Family History  Problem Relation Age of Onset   Kidney disease Mother    Heart disease Father    Obesity Sister    Heart disease Brother    Hypertension Brother    Alcohol abuse Maternal Grandfather     Social History   Socioeconomic History   Marital status: Single    Spouse name: Not on file   Number of children: Not on file   Years of education: Not on file   Highest education level: Not on file  Occupational History   Occupation: Engineer, site  Tobacco Use   Smoking status: Former    Packs/day: 1.00    Types: Cigarettes  Quit date: 2013    Years since quitting: 10.7   Smokeless tobacco: Never  Substance and Sexual Activity   Alcohol use: Yes    Comment: rare, approximately twice a year   Drug use: Not Currently    Types: Cocaine, Marijuana    Comment: remote IVDU once in late 1970s   Sexual activity: Yes    Birth control/protection: Condom  Other Topics Concern   Not on file  Social History Narrative   Not on file   Social Determinants of Health   Financial Resource Strain: Not on file  Food Insecurity: Not on file  Transportation Needs: Not on file  Physical Activity: Not on file  Stress: Not on file  Social Connections: Not on file  Intimate Partner Violence: Not on file    Outpatient Medications Prior to  Visit  Medication Sig Dispense Refill   atorvastatin (LIPITOR) 10 MG tablet Take 1 tablet (10 mg total) by mouth daily. 90 tablet 3   lisinopril-hydrochlorothiazide (ZESTORETIC) 20-25 MG tablet Take 1 tablet by mouth daily. 90 tablet 3   metFORMIN (GLUCOPHAGE-XR) 500 MG 24 hr tablet Take 1 tablet (500 mg total) by mouth daily with breakfast. 90 tablet 2   No facility-administered medications prior to visit.    No Known Allergies  ROS Review of Systems  Constitutional: Negative.   HENT: Negative.  Negative for ear pain, postnasal drip, rhinorrhea, sinus pressure, sore throat, trouble swallowing and voice change.   Eyes: Negative.   Respiratory: Negative.  Negative for apnea, cough, choking, chest tightness, shortness of breath, wheezing and stridor.   Cardiovascular: Negative.  Negative for chest pain, palpitations and leg swelling.  Gastrointestinal: Negative.  Negative for abdominal distention, abdominal pain, nausea and vomiting.  Genitourinary: Negative.   Musculoskeletal: Negative.  Negative for arthralgias and myalgias.  Skin: Negative.  Negative for rash.  Allergic/Immunologic: Negative.  Negative for environmental allergies and food allergies.  Neurological: Negative.  Negative for dizziness, syncope, weakness and headaches.  Hematological: Negative.  Negative for adenopathy. Does not bruise/bleed easily.  Psychiatric/Behavioral: Negative.  Negative for agitation and sleep disturbance. The patient is not nervous/anxious.       Objective:    Physical Exam Vitals reviewed.  Constitutional:      Appearance: Normal appearance. She is well-developed and normal weight. She is not diaphoretic.  HENT:     Head: Normocephalic and atraumatic.     Nose: No nasal deformity, septal deviation, mucosal edema or rhinorrhea.     Right Sinus: No maxillary sinus tenderness or frontal sinus tenderness.     Left Sinus: No maxillary sinus tenderness or frontal sinus tenderness.      Mouth/Throat:     Pharynx: No oropharyngeal exudate.     Comments: Multiple dental caries seen Eyes:     General: No scleral icterus.    Conjunctiva/sclera: Conjunctivae normal.     Pupils: Pupils are equal, round, and reactive to light.  Neck:     Thyroid: No thyromegaly.     Vascular: No carotid bruit or JVD.     Trachea: Trachea normal. No tracheal tenderness or tracheal deviation.  Cardiovascular:     Rate and Rhythm: Normal rate and regular rhythm.     Chest Wall: PMI is not displaced.     Pulses: Normal pulses. No decreased pulses.     Heart sounds: Normal heart sounds, S1 normal and S2 normal. Heart sounds not distant. No murmur heard.    No systolic murmur is present.  No diastolic murmur is present.     No friction rub. No gallop. No S3 or S4 sounds.  Pulmonary:     Effort: No tachypnea, accessory muscle usage or respiratory distress.     Breath sounds: No stridor. No decreased breath sounds, wheezing, rhonchi or rales.  Chest:     Chest wall: No tenderness.  Abdominal:     General: Bowel sounds are normal. There is no distension.     Palpations: Abdomen is soft. Abdomen is not rigid.     Tenderness: There is no abdominal tenderness. There is no guarding or rebound.  Musculoskeletal:        General: Normal range of motion.     Cervical back: Normal range of motion and neck supple. No edema, erythema or rigidity. No muscular tenderness. Normal range of motion.  Lymphadenopathy:     Head:     Right side of head: No submental or submandibular adenopathy.     Left side of head: No submental or submandibular adenopathy.     Cervical: No cervical adenopathy.  Skin:    General: Skin is warm and dry.     Coloration: Skin is not pale.     Findings: No lesion or rash.     Nails: There is no clubbing.  Neurological:     Mental Status: She is alert and oriented to person, place, and time.     Sensory: No sensory deficit.  Psychiatric:        Mood and Affect: Mood normal.         Speech: Speech normal.        Behavior: Behavior normal.        Thought Content: Thought content normal.     BP 115/75   Pulse (!) 52   Ht 5' 5" (1.651 m)   Wt 139 lb 3.2 oz (63.1 kg)   SpO2 100%   BMI 23.16 kg/m  Wt Readings from Last 3 Encounters:  02/22/22 139 lb 3.2 oz (63.1 kg)  09/20/21 164 lb 3.2 oz (74.5 kg)  04/20/21 169 lb 6.4 oz (76.8 kg)     There are no preventive care reminders to display for this patient.    There are no preventive care reminders to display for this patient.  Lab Results  Component Value Date   TSH 2.430 01/17/2019   Lab Results  Component Value Date   WBC 6.2 09/20/2021   HGB 12.7 09/20/2021   HCT 38.3 09/20/2021   MCV 87 09/20/2021   PLT 207 09/20/2021   Lab Results  Component Value Date   NA 140 09/20/2021   K 4.5 09/20/2021   CO2 22 09/20/2021   GLUCOSE 90 09/20/2021   BUN 20 09/20/2021   CREATININE 0.86 09/20/2021   BILITOT 0.3 09/20/2021   ALKPHOS 75 09/20/2021   AST 28 09/20/2021   ALT 32 09/20/2021   PROT 7.6 09/20/2021   ALBUMIN 4.7 09/20/2021   CALCIUM 9.9 09/20/2021   ANIONGAP 10 12/21/2018   EGFR 74 09/20/2021   Lab Results  Component Value Date   CHOL 237 (H) 09/20/2021   Lab Results  Component Value Date   HDL 57 09/20/2021   Lab Results  Component Value Date   LDLCALC 153 (H) 09/20/2021   Lab Results  Component Value Date   TRIG 148 09/20/2021   Lab Results  Component Value Date   CHOLHDL 4.2 09/20/2021  FINDINGS: AP LUMBAR SPINE (L1-L4)   Bone Mineral Density (BMD):  1.079 g/cm2  Young Adult T-Score:  0.3   Z-Score:  2.1   LEFT FEMUR NECK   Bone Mineral Density (BMD):  0.784 g/cm2   Young Adult T-Score: -0.6   Z-Score:  1.0   Unit: This study was performed at [facility] on the [place] (S/N [phone]), software version 13.4.2.   Scan quality: The scan quality is good. Exclusions: None.   ASSESSMENT: Patient's diagnostic category is NORMAL by WHO  Criteria.   FRACTURE RISK: NOT INCREASED.   FRAX: World Health Organization FRAX assessment of absolute fracture risk is not calculated for this patient because the patient has normal bone mineral density with all T-scores at or above -1.0.   COMPARISON: None.   RECOMMENDATIONS   1. All patients should optimize calcium and vitamin D intake.   2. Consider FDA-approved medical therapies in postmenopausal women and men aged 70 years and older, based on the following:   - A hip or vertebral (clinical or morphometric) fracture   - T-score less than or equal to -2.5 at the femoral neck or spine after appropriate evaluation to exclude secondary causes   - Low bone mass (T-score between -1.0 and -2.5 at the femoral neck or spine) and a 10-year probability of a hip fracture greater than or equal to 3% or a 10-year probability of a major osteoporosis-related fracture greater than or equal to 20% based on the [place]-adapted WHO algorithm   - Clinician judgment and/or patient preferences may indicate treatment for people with 10-year fracture probabilities above or below these levels   3. Patients with diagnosis of osteoporosis or at high risk for fracture should have regular bone mineral density tests. For patients eligible for Medicare, routine testing is allowed once every 2 years. The testing frequency can be increased to one year for patients who have rapidly progressing disease, those who are receiving or discontinuing medical therapy to restore bone mass, or have additional risk factors.     Electronically Signed   By: Evangeline Dakin M.D.   On: 05/14/2021 07:40   Lab Results  Component Value Date   HGBA1C 6.2 (H) 09/20/2021      Assessment & Plan:   Problem List Items Addressed This Visit       Cardiovascular and Mediastinum   HTN (hypertension) - Primary     Controlled blood pressure with lifestyle medicine approach she will continue this diet approach and  exercise and we will assess her labs we may be able to reduce her blood pressure medicine dosing      Relevant Orders   CBC with Differential/Platelet     Other   Hyperlipidemia    Reassess lipid panel      Relevant Orders   Lipid panel   Encounter for screening mammogram for malignant neoplasm of breast    Repeat mammogram December of this year      Relevant Orders   MM DIGITAL SCREENING BILATERAL   Prediabetes    Reassess hemoglobin A1c we may be able to discontinue the metformin      Relevant Orders   Hemoglobin A1c   Comprehensive metabolic panel   Other Visit Diagnoses     Need for immunization against influenza       Relevant Orders   Flu Vaccine QUAD 87moIM (Fluarix, Fluzone & Alfiuria Quad PF) (Completed)     No orders of the defined types were placed in this encounter.   Follow-up: Return in about 5 months (around 07/24/2022) for htn.  Asencion Noble, MD

## 2022-02-22 ENCOUNTER — Ambulatory Visit: Payer: Medicare Other | Attending: Critical Care Medicine | Admitting: Critical Care Medicine

## 2022-02-22 ENCOUNTER — Encounter: Payer: Self-pay | Admitting: Critical Care Medicine

## 2022-02-22 VITALS — BP 115/75 | HR 52 | Ht 65.0 in | Wt 139.2 lb

## 2022-02-22 DIAGNOSIS — R7303 Prediabetes: Secondary | ICD-10-CM

## 2022-02-22 DIAGNOSIS — I10 Essential (primary) hypertension: Secondary | ICD-10-CM | POA: Diagnosis not present

## 2022-02-22 DIAGNOSIS — E782 Mixed hyperlipidemia: Secondary | ICD-10-CM

## 2022-02-22 DIAGNOSIS — Z1231 Encounter for screening mammogram for malignant neoplasm of breast: Secondary | ICD-10-CM

## 2022-02-22 DIAGNOSIS — Z23 Encounter for immunization: Secondary | ICD-10-CM | POA: Diagnosis not present

## 2022-02-22 NOTE — Assessment & Plan Note (Signed)
Reassess hemoglobin A1c we may be able to discontinue the metformin

## 2022-02-22 NOTE — Assessment & Plan Note (Signed)
Reassess lipid panel 

## 2022-02-22 NOTE — Assessment & Plan Note (Signed)
  Controlled blood pressure with lifestyle medicine approach she will continue this diet approach and exercise and we will assess her labs we may be able to reduce her blood pressure medicine dosing

## 2022-02-22 NOTE — Assessment & Plan Note (Signed)
Repeat mammogram December of this year

## 2022-02-22 NOTE — Patient Instructions (Signed)
We will recheck labs  Great work on your Lifestyle medicine changes  Will call results ,may be able to reduce medications  Return Dr Joya Gaskins 5 months

## 2022-02-23 ENCOUNTER — Other Ambulatory Visit: Payer: Self-pay | Admitting: Critical Care Medicine

## 2022-02-23 LAB — CBC WITH DIFFERENTIAL/PLATELET
Basophils Absolute: 0 10*3/uL (ref 0.0–0.2)
Basos: 1 %
EOS (ABSOLUTE): 0.2 10*3/uL (ref 0.0–0.4)
Eos: 3 %
Hematocrit: 36.8 % (ref 34.0–46.6)
Hemoglobin: 12.1 g/dL (ref 11.1–15.9)
Immature Grans (Abs): 0 10*3/uL (ref 0.0–0.1)
Immature Granulocytes: 0 %
Lymphocytes Absolute: 2 10*3/uL (ref 0.7–3.1)
Lymphs: 36 %
MCH: 29.2 pg (ref 26.6–33.0)
MCHC: 32.9 g/dL (ref 31.5–35.7)
MCV: 89 fL (ref 79–97)
Monocytes Absolute: 0.6 10*3/uL (ref 0.1–0.9)
Monocytes: 10 %
Neutrophils Absolute: 2.9 10*3/uL (ref 1.4–7.0)
Neutrophils: 50 %
Platelets: 203 10*3/uL (ref 150–450)
RBC: 4.14 x10E6/uL (ref 3.77–5.28)
RDW: 13.5 % (ref 11.7–15.4)
WBC: 5.6 10*3/uL (ref 3.4–10.8)

## 2022-02-23 LAB — COMPREHENSIVE METABOLIC PANEL
ALT: 18 IU/L (ref 0–32)
AST: 23 IU/L (ref 0–40)
Albumin/Globulin Ratio: 2.2 (ref 1.2–2.2)
Albumin: 4.7 g/dL (ref 3.9–4.9)
Alkaline Phosphatase: 56 IU/L (ref 44–121)
BUN/Creatinine Ratio: 24 (ref 12–28)
BUN: 23 mg/dL (ref 8–27)
Bilirubin Total: 0.3 mg/dL (ref 0.0–1.2)
CO2: 24 mmol/L (ref 20–29)
Calcium: 9.7 mg/dL (ref 8.7–10.3)
Chloride: 102 mmol/L (ref 96–106)
Creatinine, Ser: 0.95 mg/dL (ref 0.57–1.00)
Globulin, Total: 2.1 g/dL (ref 1.5–4.5)
Glucose: 85 mg/dL (ref 70–99)
Potassium: 4.5 mmol/L (ref 3.5–5.2)
Sodium: 140 mmol/L (ref 134–144)
Total Protein: 6.8 g/dL (ref 6.0–8.5)
eGFR: 66 mL/min/{1.73_m2} (ref >59)

## 2022-02-23 LAB — LIPID PANEL
Chol/HDL Ratio: 2.2 ratio (ref 0.0–4.4)
Cholesterol, Total: 144 mg/dL (ref 100–199)
HDL: 66 mg/dL (ref >39)
LDL Chol Calc (NIH): 63 mg/dL (ref 0–99)
Triglycerides: 80 mg/dL (ref 0–149)
VLDL Cholesterol Cal: 15 mg/dL (ref 5–40)

## 2022-02-23 LAB — HEMOGLOBIN A1C
Est. average glucose Bld gHb Est-mCnc: 126 mg/dL
Hgb A1c MFr Bld: 6 % — ABNORMAL HIGH (ref 4.8–5.6)

## 2022-02-23 MED ORDER — METFORMIN HCL ER 500 MG PO TB24: 500.0000 mg | ORAL_TABLET | Freq: Every day | ORAL | 2 refills | Status: DC

## 2022-02-23 MED ORDER — LISINOPRIL 20 MG PO TABS: 20.0000 mg | ORAL_TABLET | Freq: Every day | ORAL | 3 refills | Status: DC

## 2022-02-23 MED ORDER — ATORVASTATIN CALCIUM 10 MG PO TABS: ORAL_TABLET | ORAL | 3 refills | Status: DC

## 2022-02-23 NOTE — Progress Notes (Signed)
A1C is at goal, I do recommend she stay on one metformin daily,  blood count kidney normal, cholesterol at goal with health changes the best it has ever been.    I suggest changing zestoretic to lisinopril '20mg'$  daily and take out the HCTZ fluid pill component.  I have sent updated Rx to pharmacy.   She can also reduce atorvastatin to taking twice a week her own choosing of days of week,  it is ok if the days vary as long as on average two pills in 7 days are taken.  Stay on metformin once daily with food

## 2022-02-25 ENCOUNTER — Ambulatory Visit: Payer: Medicare Other | Attending: Critical Care Medicine

## 2022-02-25 DIAGNOSIS — Z0000 Encounter for general adult medical examination without abnormal findings: Secondary | ICD-10-CM | POA: Diagnosis not present

## 2022-02-25 NOTE — Progress Notes (Signed)
Subjective:   Lynix Bonine is a 66 y.o. female who presents for Medicare Annual (Subsequent) preventive examination.  Review of Systems    I connected with Ms.Deliah Boston by telephone and verified that I am speaking with the correct person using two identifiers. I discussed the limitations, risks, security and privacy concerns of performing an evaluation and management service by telephone and the availability of in person appointments. I also discussed with the patient that there may be a patient responsible charge related to this service. The patient expressed understanding and agreed to proceed.  Patient location:  Home  My Location: [facility] Persons on the telephone call:    Adiyah Lame. Deliah Boston       Objective:    There were no vitals filed for this visit. There is no height or weight on file to calculate BMI.     02/25/2022    4:02 PM 12/21/2018   12:19 PM  Advanced Directives  Does Patient Have a Medical Advance Directive? No;Yes No  Would patient like information on creating a medical advance directive?  No - Patient declined    Current Medications (verified) Outpatient Encounter Medications as of 02/25/2022  Medication Sig   atorvastatin (LIPITOR) 10 MG tablet Take one tablet twice a week   lisinopril (ZESTRIL) 20 MG tablet Take 1 tablet (20 mg total) by mouth daily.   metFORMIN (GLUCOPHAGE-XR) 500 MG 24 hr tablet Take 1 tablet (500 mg total) by mouth daily with breakfast.   No facility-administered encounter medications on file as of 02/25/2022.    Allergies (verified) Patient has no known allergies.   History: Past Medical History:  Diagnosis Date   Blood transfusion without reported diagnosis    ~1996 with gastroenteritis/food poisoning   Chronic hepatitis C ([place]) 01/24/2019   Chronic hepatitis C without hepatic coma ([place]) 01/18/2019   Hypertension    Past Surgical History:  Procedure Laterality Date   ABDOMINAL HYSTERECTOMY     Family  History  Problem Relation Age of Onset   Kidney disease Mother    Heart disease Father    Obesity Sister    Heart disease Brother    Hypertension Brother    Alcohol abuse Maternal Grandfather    Social History   Socioeconomic History   Marital status: Single    Spouse name: Not on file   Number of children: Not on file   Years of education: Not on file   Highest education level: Not on file  Occupational History   Occupation: Engineer, site  Tobacco Use   Smoking status: Former    Packs/day: 1.00    Types: Cigarettes    Quit date: 2013    Years since quitting: 10.7   Smokeless tobacco: Never  Substance and Sexual Activity   Alcohol use: Yes    Comment: rare, approximately twice a year   Drug use: Not Currently    Types: Cocaine, Marijuana    Comment: remote IVDU once in late 1970s   Sexual activity: Yes    Birth control/protection: Condom  Other Topics Concern   Not on file  Social History Narrative   Not on file   Social Determinants of Health   Financial Resource Strain: Low Risk  (02/25/2022)   Overall Financial Resource Strain (CARDIA)    Difficulty of Paying Living Expenses: Not hard at all  Food Insecurity: No Food Insecurity (02/25/2022)   Hunger Vital Sign    Worried About Running Out of Food  in the Last Year: Never true    [place] in the Last Year: Never true  Transportation Needs: No Transportation Needs (02/25/2022)   PRAPARE - Hydrologist (Medical): No    Lack of Transportation (Non-Medical): No  Physical Activity: Sufficiently Active (02/25/2022)   Exercise Vital Sign    Days of Exercise per Week: 5 days    Minutes of Exercise per Session: 120 min  Stress: No Stress Concern Present (02/25/2022)   [place]    Feeling of Stress : Not at all  Social Connections: Socially Isolated (02/25/2022)   Social Connection and Isolation Panel  [NHANES]    Frequency of Communication with Friends and Family: Three times a week    Frequency of Social Gatherings with Friends and Family: More than three times a week    Attends Religious Services: Never    Marine scientist or Organizations: No    Attends Archivist Meetings: Never    Marital Status: Divorced    Tobacco Counseling Counseling given: Not Answered   Clinical Intake:     Pain : No/denies pain     Diabetes: No     Diabetic?         Activities of Daily Living    02/25/2022    4:06 PM  In your present state of health, do you have any difficulty performing the following activities:  Hearing? 0  Vision? 0  Difficulty concentrating or making decisions? 0  Walking or climbing stairs? 0  Dressing or bathing? 0  Doing errands, shopping? 0  Preparing Food and eating ? N  Using the Toilet? N  In the past six months, have you accidently leaked urine? N  Do you have problems with loss of bowel control? N  Managing your Medications? N  Managing your Finances? N  Housekeeping or managing your Housekeeping? N    Patient Care Team: Elsie Stain, MD as PCP - General (Pulmonary Disease)  Indicate any recent Medical Services you may have received from other than [facility] providers in the past year (date may be approximate).     Assessment:   This is a routine wellness examination for [facility].  Hearing/Vision screen No results found.  Dietary issues and exercise activities discussed:     Goals Addressed   None   Depression Screen    02/22/2022    3:05 PM 09/20/2021    3:03 PM 04/20/2021    3:07 PM 04/07/2020    8:52 AM 01/17/2019   10:53 AM  PHQ 2/9 Scores  PHQ - 2 Score 0 0 0 0 0  PHQ- 9 Score   0  1    Fall Risk    02/25/2022    4:02 PM 02/22/2022    3:05 PM 09/20/2021    3:03 PM 04/20/2021    3:07 PM 11/23/2020    9:11 AM  Fall Risk   Falls in the past year? 0 0 0 0 0  Number falls in past yr: 0 0 0 0 0  Injury with  Fall? 0 0 0 0 0  Risk for fall due to : No Fall Risks No Fall Risks No Fall Risks No Fall Risks     FALL RISK PREVENTION PERTAINING TO THE HOME:  Any stairs in or around the home? No  If so, are there any without handrails? No  Home free of loose throw rugs in walkways,  pet beds, electrical cords, etc? Yes  Adequate lighting in your home to reduce risk of falls? Yes   ASSISTIVE DEVICES UTILIZED TO PREVENT FALLS:  Life alert? No  Use of a cane, walker or w/c? No  Grab bars in the bathroom? No  Shower chair or bench in shower? No  Elevated toilet seat or a handicapped toilet? No   TIMED UP AND GO:  Was the test performed? Yes .  Length of time to ambulate 10 feet:  sec.   Gait steady and fast without use of assistive device  Cognitive Function:    02/25/2022    4:03 PM 04/07/2020    8:53 AM  MMSE - Mini Mental State Exam  Orientation to time 5 5  Orientation to Place 5 5  Registration 3 3  Attention/ Calculation 5 5  Recall 3 3  Language- name 2 objects 2 2  Language- repeat 1 1  Language- follow 3 step command 3 3  Language- read & follow direction 1 1  Write a sentence 1 1  Copy design 1 1  Total score 30 30        Immunizations Immunization History  Administered Date(s) Administered   Influenza,inj,Quad PF,6+ Mos 01/17/2019, 04/07/2020, 04/20/2021, 02/22/2022   PFIZER(Purple Top)SARS-COV-2 Vaccination 09/21/2019, 10/15/2019, 04/21/2020, 12/06/2020   PNEUMOCOCCAL CONJUGATE-20 04/20/2021   Pneumococcal Conjugate-13 04/07/2020   Tdap 01/17/2019   Zoster Recombinat (Shingrix) 11/23/2020, 09/20/2021    TDAP status: Up to date  Flu Vaccine status: Up to date  Pneumococcal vaccine status: Up to date  Covid-19 vaccine status: Completed vaccines  Qualifies for Shingles Vaccine? Yes   Zostavax completed Yes   Shingrix Completed?: Yes  Screening Tests Health Maintenance  Topic Date Due   MAMMOGRAM  11/02/2023   Fecal DNA (Cologuard)  12/01/2023    TETANUS/TDAP  01/16/2029   Pneumonia Vaccine 78+ Years old  Completed   INFLUENZA VACCINE  Completed   DEXA SCAN  Completed   Hepatitis C Screening  Completed   Zoster Vaccines- Shingrix  Completed   HPV VACCINES  Aged Out   COVID-19 Vaccine  Discontinued    Health Maintenance  There are no preventive care reminders to display for this patient.  Colorectal cancer screening: Type of screening: Cologuard. Completed 11/21/2023. Repeat every 3 years  Mammogram status: Completed 11/01/2021. Repeat every year  Bone Density status: Completed 05/13/2021. Results reflect: Bone density results: NORMAL. Repeat every 2 years.  Lung Cancer Screening: (Low Dose CT Chest recommended if Age 33-80 years, 30 pack-year currently smoking OR have quit w/in 15years.) does qualify.   Lung Cancer Screening Referral:   Additional Screening:  Hepatitis C Screening: does qualify; Completed 09/20/2021  Vision Screening: Recommended annual ophthalmology exams for early detection of glaucoma and other disorders of the eye. Is the patient up to date with their annual eye exam?  Yes  Who is the provider or what is the name of the office in which the patient attends annual eye exams?  If pt is not established with a provider, would they like to be referred to a provider to establish care? No .   Dental Screening: Recommended annual dental exams for proper oral hygiene  Community Resource Referral / Chronic Care Management: CRR required this visit?  No   CCM required this visit?  No      Plan:     I have personally reviewed and noted the following in the patient's chart:   Medical and social history Use of alcohol,  tobacco or illicit drugs  Current medications and supplements including opioid prescriptions. Patient is not currently taking opioid prescriptions. Functional ability and status Nutritional status Physical activity Advanced directives List of other physicians Hospitalizations, surgeries,  and ER visits in previous 12 months Vitals Screenings to include cognitive, depression, and falls Referrals and appointments  In addition, I have reviewed and discussed with patient certain preventive protocols, quality metrics, and best practice recommendations. A written personalized care plan for preventive services as well as general preventive health recommendations were provided to patient.     Lillie Columbia, [place]   02/25/2022   Nurse Notes:

## 2022-03-04 ENCOUNTER — Other Ambulatory Visit: Payer: Self-pay | Admitting: Critical Care Medicine

## 2022-03-04 DIAGNOSIS — Z1231 Encounter for screening mammogram for malignant neoplasm of breast: Secondary | ICD-10-CM

## 2022-07-16 NOTE — Progress Notes (Unsigned)
Established Patient Office Visit  Subjective:  Patient ID: Rochella Scarfo, female    DOB: 20-Feb-1956  Age: 67 y.o. MRN: [id number]  CC: Primary care follow-up visit:    HPI 04/2021 Artha Zemba presents for primary care follow-up.  Patient is due a flu vaccine and pneumonia vaccine she agrees to receive both at this visit.  She would also like a referral back to ophthalmology for another eye exam.  Patient's continues to try to lose weight she is down to a BMI of 27.3.  She is interested in joining a fitness center and is can be looking at options that may be covered under her new Medicare advantage insurance plan.  On arrival blood pressure 128/84.  She is fully vaccinated against COVID.  She has no other real complaints at this time.  She does have an upcoming DEXA scan scheduled for January  The patient is due the second zoster and  09/20/21 Patient returns in follow-up.  Since the last office visit she had a bone DEXA scan that was normal.  Blood pressure on arrival is good 128/85.  She maintains Zestoretic and needs refills.  She is trying to eat a healthier diet.  She does need a follow-up lipid panel and health screening lab work.  She has skin tags under the left eye and under the left breast she would like removed.  Patient does have severe dental disease with multiple dental caries and is planning dental visit soon.  Patient had a mammogram in December which showed some bone densities was hard to read but largely was negative for malignancy.  They are recommending repeat study in 1 year not 2 years.  Cologuard study in 2022 was negative repeat is going to be 2025.  Patient does not have any other real complaints at this visit.  She is due a second shingles vaccine and agrees to receive it at our office.  10/10 Patient seen in follow-up she has lost 30 pounds in weight following a lifestyle medicine approach.  She is off all sugary substances and eating a plant-based diet.  Blood  pressure on arrival 115/75.  This is a dramatic improvement.  She is on low-dose lisinopril HCTZ.  Also has hyperlipidemia we will need to follow-up lipid panel.  She has a follow-up mammogram for December of this year.  She has no other primary care gaps and she did agree to receive the flu vaccine this visit  Past Medical History:  Diagnosis Date   Blood transfusion without reported diagnosis    ~1996 with gastroenteritis/food poisoning   Chronic hepatitis C ([place]) 01/24/2019   Chronic hepatitis C without hepatic coma ([place]) 01/18/2019   Hypertension     Past Surgical History:  Procedure Laterality Date   ABDOMINAL HYSTERECTOMY      Family History  Problem Relation Age of Onset   Kidney disease Mother    Heart disease Father    Obesity Sister    Heart disease Brother    Hypertension Brother    Alcohol abuse Maternal Grandfather     Social History   Socioeconomic History   Marital status: Single    Spouse name: Not on file   Number of children: Not on file   Years of education: Not on file   Highest education level: Not on file  Occupational History   Occupation: Engineer, site  Tobacco Use   Smoking status: Former    Packs/day: 1.00    Types: Cigarettes  Quit date: 2013    Years since quitting: 11.1   Smokeless tobacco: Never  Substance and Sexual Activity   Alcohol use: Yes    Comment: rare, approximately twice a year   Drug use: Not Currently    Types: Cocaine, Marijuana    Comment: remote IVDU once in late 1970s   Sexual activity: Yes    Birth control/protection: Condom  Other Topics Concern   Not on file  Social History Narrative   Not on file   Social Determinants of Health   Financial Resource Strain: Low Risk  (02/25/2022)   Overall Financial Resource Strain (CARDIA)    Difficulty of Paying Living Expenses: Not hard at all  Food Insecurity: No Food Insecurity (02/25/2022)   Hunger Vital Sign    Worried About Running Out of Food in the Last  Year: Never true    Ran Out of Food in the Last Year: Never true  Transportation Needs: No Transportation Needs (02/25/2022)   PRAPARE - Hydrologist (Medical): No    Lack of Transportation (Non-Medical): No  Physical Activity: Sufficiently Active (02/25/2022)   Exercise Vital Sign    Days of Exercise per Week: 5 days    Minutes of Exercise per Session: 120 min  Stress: No Stress Concern Present (02/25/2022)   Page    Feeling of Stress : Not at all  Social Connections: Socially Isolated (02/25/2022)   Social Connection and Isolation Panel [NHANES]    Frequency of Communication with Friends and Family: Three times a week    Frequency of Social Gatherings with Friends and Family: More than three times a week    Attends Religious Services: Never    Marine scientist or Organizations: No    Attends Archivist Meetings: Never    Marital Status: Divorced  Human resources officer Violence: Not on file    Outpatient Medications Prior to Visit  Medication Sig Dispense Refill   atorvastatin (LIPITOR) 10 MG tablet Take one tablet twice a week 60 tablet 3   lisinopril (ZESTRIL) 20 MG tablet Take 1 tablet (20 mg total) by mouth daily. 90 tablet 3   metFORMIN (GLUCOPHAGE-XR) 500 MG 24 hr tablet Take 1 tablet (500 mg total) by mouth daily with breakfast. 90 tablet 2   No facility-administered medications prior to visit.    No Known Allergies  ROS Review of Systems  Constitutional: Negative.   HENT: Negative.  Negative for ear pain, postnasal drip, rhinorrhea, sinus pressure, sore throat, trouble swallowing and voice change.   Eyes: Negative.   Respiratory: Negative.  Negative for apnea, cough, choking, chest tightness, shortness of breath, wheezing and stridor.   Cardiovascular: Negative.  Negative for chest pain, palpitations and leg swelling.  Gastrointestinal: Negative.  Negative  for abdominal distention, abdominal pain, nausea and vomiting.  Genitourinary: Negative.   Musculoskeletal: Negative.  Negative for arthralgias and myalgias.  Skin: Negative.  Negative for rash.  Allergic/Immunologic: Negative.  Negative for environmental allergies and food allergies.  Neurological: Negative.  Negative for dizziness, syncope, weakness and headaches.  Hematological: Negative.  Negative for adenopathy. Does not bruise/bleed easily.  Psychiatric/Behavioral: Negative.  Negative for agitation and sleep disturbance. The patient is not nervous/anxious.       Objective:    Physical Exam Vitals reviewed.  Constitutional:      Appearance: Normal appearance. She is well-developed and normal weight. She is not diaphoretic.  HENT:  Head: Normocephalic and atraumatic.     Nose: No nasal deformity, septal deviation, mucosal edema or rhinorrhea.     Right Sinus: No maxillary sinus tenderness or frontal sinus tenderness.     Left Sinus: No maxillary sinus tenderness or frontal sinus tenderness.     Mouth/Throat:     Pharynx: No oropharyngeal exudate.     Comments: Multiple dental caries seen Eyes:     General: No scleral icterus.    Conjunctiva/sclera: Conjunctivae normal.     Pupils: Pupils are equal, round, and reactive to light.  Neck:     Thyroid: No thyromegaly.     Vascular: No carotid bruit or JVD.     Trachea: Trachea normal. No tracheal tenderness or tracheal deviation.  Cardiovascular:     Rate and Rhythm: Normal rate and regular rhythm.     Chest Wall: PMI is not displaced.     Pulses: Normal pulses. No decreased pulses.     Heart sounds: Normal heart sounds, S1 normal and S2 normal. Heart sounds not distant. No murmur heard.    No systolic murmur is present.     No diastolic murmur is present.     No friction rub. No gallop. No S3 or S4 sounds.  Pulmonary:     Effort: No tachypnea, accessory muscle usage or respiratory distress.     Breath sounds: No stridor.  No decreased breath sounds, wheezing, rhonchi or rales.  Chest:     Chest wall: No tenderness.  Abdominal:     General: Bowel sounds are normal. There is no distension.     Palpations: Abdomen is soft. Abdomen is not rigid.     Tenderness: There is no abdominal tenderness. There is no guarding or rebound.  Musculoskeletal:        General: Normal range of motion.     Cervical back: Normal range of motion and neck supple. No edema, erythema or rigidity. No muscular tenderness. Normal range of motion.  Lymphadenopathy:     Head:     Right side of head: No submental or submandibular adenopathy.     Left side of head: No submental or submandibular adenopathy.     Cervical: No cervical adenopathy.  Skin:    General: Skin is warm and dry.     Coloration: Skin is not pale.     Findings: No lesion or rash.     Nails: There is no clubbing.  Neurological:     Mental Status: She is alert and oriented to person, place, and time.     Sensory: No sensory deficit.  Psychiatric:        Mood and Affect: Mood normal.        Speech: Speech normal.        Behavior: Behavior normal.        Thought Content: Thought content normal.     There were no vitals taken for this visit. Wt Readings from Last 3 Encounters:  02/22/22 139 lb 3.2 oz (63.1 kg)  09/20/21 164 lb 3.2 oz (74.5 kg)  04/20/21 169 lb 6.4 oz (76.8 kg)     There are no preventive care reminders to display for this patient.    There are no preventive care reminders to display for this patient.  Lab Results  Component Value Date   TSH 2.430 01/17/2019   Lab Results  Component Value Date   WBC 5.6 02/22/2022   HGB 12.1 02/22/2022   HCT 36.8 02/22/2022   MCV 89 02/22/2022  PLT 203 02/22/2022   Lab Results  Component Value Date   NA 140 02/22/2022   K 4.5 02/22/2022   CO2 24 02/22/2022   GLUCOSE 85 02/22/2022   BUN 23 02/22/2022   CREATININE 0.95 02/22/2022   BILITOT 0.3 02/22/2022   ALKPHOS 56 02/22/2022   AST 23  02/22/2022   ALT 18 02/22/2022   PROT 6.8 02/22/2022   ALBUMIN 4.7 02/22/2022   CALCIUM 9.7 02/22/2022   ANIONGAP 10 12/21/2018   EGFR 66 02/22/2022   Lab Results  Component Value Date   CHOL 144 02/22/2022   Lab Results  Component Value Date   HDL 66 02/22/2022   Lab Results  Component Value Date   LDLCALC 63 02/22/2022   Lab Results  Component Value Date   TRIG 80 02/22/2022   Lab Results  Component Value Date   CHOLHDL 2.2 02/22/2022  FINDINGS: AP LUMBAR SPINE (L1-L4)   Bone Mineral Density (BMD):  1.079 g/cm2   Young Adult T-Score:  0.3   Z-Score:  2.1   LEFT FEMUR NECK   Bone Mineral Density (BMD):  0.784 g/cm2   Young Adult T-Score: -0.6   Z-Score:  1.0   Unit: This study was performed at [facility] [place] (S/N [phone]), software version 13.4.2.   Scan quality: The scan quality is good. Exclusions: None.   ASSESSMENT: Patient's diagnostic category is NORMAL by WHO Criteria.   FRACTURE RISK: NOT INCREASED.   FRAX: World Health Organization FRAX assessment of absolute fracture risk is not calculated for this patient because the patient has normal bone mineral density with all T-scores at or above -1.0.   COMPARISON: None.   RECOMMENDATIONS   1. All patients should optimize calcium and vitamin D intake.   2. Consider FDA-approved medical therapies in postmenopausal women and men aged 36 years and older, based on the following:   - A hip or vertebral (clinical or morphometric) fracture   - T-score less than or equal to -2.5 at the femoral neck or spine after appropriate evaluation to exclude secondary causes   - Low bone mass (T-score between -1.0 and -2.5 at the femoral neck or spine) and a 10-year probability of a hip fracture greater than or equal to 3% or a 10-year probability of a major osteoporosis-related fracture greater than or equal to 20% based on the [place]-adapted WHO algorithm   - Clinician judgment  and/or patient preferences may indicate treatment for people with 10-year fracture probabilities above or below these levels   3. Patients with diagnosis of osteoporosis or at high risk for fracture should have regular bone mineral density tests. For patients eligible for Medicare, routine testing is allowed once every 2 years. The testing frequency can be increased to one year for patients who have rapidly progressing disease, those who are receiving or discontinuing medical therapy to restore bone mass, or have additional risk factors.     Electronically Signed   By: Evangeline Dakin M.D.   On: 05/14/2021 07:40   Lab Results  Component Value Date   HGBA1C 6.0 (H) 02/22/2022      Assessment & Plan:   Problem List Items Addressed This Visit   None No orders of the defined types were placed in this encounter.   Follow-up: No follow-ups on file.    Asencion Noble, MD

## 2022-07-19 ENCOUNTER — Ambulatory Visit: Payer: Medicare Other | Attending: Critical Care Medicine | Admitting: Critical Care Medicine

## 2022-07-19 ENCOUNTER — Encounter: Payer: Self-pay | Admitting: Critical Care Medicine

## 2022-07-19 VITALS — BP 200/100 | HR 59 | Wt 129.2 lb

## 2022-07-19 DIAGNOSIS — I1A0 Resistant hypertension: Secondary | ICD-10-CM

## 2022-07-19 MED ORDER — AMLODIPINE BESYLATE 10 MG PO TABS: 10.0000 mg | ORAL_TABLET | Freq: Every day | ORAL | 2 refills | Status: DC

## 2022-07-19 MED ORDER — LISINOPRIL-HYDROCHLOROTHIAZIDE 20-25 MG PO TABS: 1.0000 | ORAL_TABLET | Freq: Every day | ORAL | 3 refills | Status: DC

## 2022-07-19 NOTE — Assessment & Plan Note (Addendum)
Resistant hypertension I am concerned about a pheochromocytoma with history of night sweats  I will obtain a 24-hour urine for 5 HI AA, metanephrines, catecholamines.  Increase to Zestoretic 20/25 daily and add amlodipine 10 mg daily.  Bring patient back in 2 weeks for recheck and she is to measure blood pressure at home bring numbers back with her

## 2022-07-19 NOTE — Patient Instructions (Addendum)
Stop lisinopril  Start lisinopril HCT one daily  Start amlodipine '10mg'$  daily  No other medication changes  Urine study for hormones that can cause elevated blood presssure  Return Dr Joya Gaskins 2 weeks for blood pressure check

## 2022-07-21 DIAGNOSIS — I1A0 Resistant hypertension: Secondary | ICD-10-CM | POA: Diagnosis not present

## 2022-07-25 DIAGNOSIS — I1A0 Resistant hypertension: Secondary | ICD-10-CM | POA: Diagnosis not present

## 2022-07-26 ENCOUNTER — Ambulatory Visit: Payer: Medicare Other | Admitting: Critical Care Medicine

## 2022-07-26 ENCOUNTER — Other Ambulatory Visit: Payer: Self-pay | Admitting: Critical Care Medicine

## 2022-07-26 LAB — METANEPHRINES, URINE, 24 HOUR
Metaneph Total, Ur: 41 ug/L
Metanephrines, 24H Ur: 115 ug/24 hr (ref 36–209)
Normetanephrine, 24H Ur: 232 ug/24 hr (ref 131–612)
Normetanephrine, Ur: 83 ug/L

## 2022-07-26 LAB — 5 HIAA, QUANTITATIVE, URINE, 24 HOUR
5-HIAA, Ur: 1.3 mg/L
5-HIAA,Quant.,24 Hr Urine: 3.6 mg/24 hr (ref 0.0–14.9)

## 2022-07-26 MED ORDER — CARVEDILOL 12.5 MG PO TABS: 12.5000 mg | ORAL_TABLET | Freq: Two times a day (BID) | ORAL | 3 refills | Status: DC

## 2022-07-26 NOTE — Progress Notes (Signed)
Called pt she does not have evidence of a pheochromocytoma  Plan add carvedilol bid

## 2022-07-31 LAB — CATECHOLAMINES, FRACTIONATED, URINE, 24 HOUR
Dopamine , 24H Ur: 213 ug/24 hr (ref 0–510)
Dopamine, Rand Ur: 79 ug/L
Epinephrine, 24H Ur: <8 ug/24 hr (ref 0–20)
Epinephrine, Rand Ur: <3 ug/L
Norepinephrine, 24H Ur: 43 ug/24 hr (ref 0–135)
Norepinephrine, Rand Ur: 16 ug/L

## 2022-07-31 NOTE — Progress Notes (Unsigned)
Established Patient Office Visit  Subjective:  Patient ID: Debbie Barnett, female    DOB: 05-13-1956  Age: 67 y.o. MRN: [id number]  CC: Primary care follow-up visit:    HPI 04/2021 Debbie Barnett presents for primary care follow-up.  Patient is due a flu vaccine and pneumonia vaccine she agrees to receive both at this visit.  She would also like a referral back to ophthalmology for another eye exam.  Patient's continues to try to lose weight she is down to a BMI of 27.3.  She is interested in joining a fitness center and is can be looking at options that may be covered under her new Medicare advantage insurance plan.  On arrival blood pressure 128/84.  She is fully vaccinated against COVID.  She has no other real complaints at this time.  She does have an upcoming DEXA scan scheduled for January  The patient is due the second zoster and  09/20/21 Patient returns in follow-up.  Since the last office visit she had a bone DEXA scan that was normal.  Blood pressure on arrival is good 128/85.  She maintains Zestoretic and needs refills.  She is trying to eat a healthier diet.  She does need a follow-up lipid panel and health screening lab work.  She has skin tags under the left eye and under the left breast she would like removed.  Patient does have severe dental disease with multiple dental caries and is planning dental visit soon.  Patient had a mammogram in December which showed some bone densities was hard to read but largely was negative for malignancy.  They are recommending repeat study in 1 year not 2 years.  Cologuard study in 2022 was negative repeat is going to be 2025.  Patient does not have any other real complaints at this visit.  She is due a second shingles vaccine and agrees to receive it at our office.  10/10 Patient seen in follow-up she has lost 30 pounds in weight following a lifestyle medicine approach.  She is off all sugary substances and eating a plant-based diet.  Blood  pressure on arrival 115/75.  This is a dramatic improvement.  She is on low-dose lisinopril HCTZ.  Also has hyperlipidemia we will need to follow-up lipid panel.  She has a follow-up mammogram for December of this year.  She has no other primary care gaps and she did agree to receive the flu vaccine this visit  07/19/22 Patient is seen in return follow-up blood pressure on arrival is 200/100 repeat is continues to be elevated.  She has had normal blood pressures in the past.  She notes progressive increased and night sweats which are soaking in nature.  She has never had pheochromocytoma evaluation performed.  Blood pressure in the last few visits has been normal and she has been taking the lisinopril 20 mg daily.  She has prediabetes on metformin and hyperlipidemia on atorvastatin.  08/02/22 F/u HTN Patient now has a record of blood pressures at home they have been trending down in the 128/76 range she is maintaining her antihypertensives.  Urine for catecholamines were negative she does not have a pheochromocytoma. She has no other complaints Past Medical History:  Diagnosis Date   Blood transfusion without reported diagnosis    ~1996 with gastroenteritis/food poisoning   Chronic hepatitis C ([place]) 01/24/2019   Chronic hepatitis C without hepatic coma ([place]) 01/18/2019   Hypertension     Past Surgical History:  Procedure Laterality  Date   ABDOMINAL HYSTERECTOMY      Family History  Problem Relation Age of Onset   Kidney disease Mother    Heart disease Father    Obesity Sister    Heart disease Brother    Hypertension Brother    Alcohol abuse Maternal Grandfather     Social History   Socioeconomic History   Marital status: Single    Spouse name: Not on file   Number of children: Not on file   Years of education: Not on file   Highest education level: Not on file  Occupational History   Occupation: Engineer, site  Tobacco Use   Smoking status: Former    Packs/day: 1     Types: Cigarettes    Quit date: 2013    Years since quitting: 11.2   Smokeless tobacco: Never  Substance and Sexual Activity   Alcohol use: Yes    Comment: rare, approximately twice a year   Drug use: Not Currently    Types: Cocaine, Marijuana    Comment: remote IVDU once in late 1970s   Sexual activity: Yes    Birth control/protection: Condom  Other Topics Concern   Not on file  Social History Narrative   Not on file   Social Determinants of Health   Financial Resource Strain: Low Risk  (02/25/2022)   Overall Financial Resource Strain (CARDIA)    Difficulty of Paying Living Expenses: Not hard at all  Food Insecurity: No Food Insecurity (02/25/2022)   Hunger Vital Sign    Worried About Running Out of Food in the Last Year: Never true    [place] in the Last Year: Never true  Transportation Needs: No Transportation Needs (02/25/2022)   PRAPARE - Hydrologist (Medical): No    Lack of Transportation (Non-Medical): No  Physical Activity: Sufficiently Active (02/25/2022)   Exercise Vital Sign    Days of Exercise per Week: 5 days    Minutes of Exercise per Session: 120 min  Stress: No Stress Concern Present (02/25/2022)   [place]    Feeling of Stress : Not at all  Social Connections: Socially Isolated (02/25/2022)   Social Connection and Isolation Panel [NHANES]    Frequency of Communication with Friends and Family: Three times a week    Frequency of Social Gatherings with Friends and Family: More than three times a week    Attends Religious Services: Never    Marine scientist or Organizations: No    Attends Archivist Meetings: Never    Marital Status: Divorced  Human resources officer Violence: Not on file    Outpatient Medications Prior to Visit  Medication Sig Dispense Refill   amLODipine (NORVASC) 10 MG tablet Take 1 tablet (10 mg total) by mouth daily. 90  tablet 2   atorvastatin (LIPITOR) 10 MG tablet Take one tablet twice a week 60 tablet 3   carvedilol (COREG) 12.5 MG tablet Take 1 tablet (12.5 mg total) by mouth 2 (two) times daily with a meal. 60 tablet 3   lisinopril-hydrochlorothiazide (ZESTORETIC) 20-25 MG tablet Take 1 tablet by mouth daily. 90 tablet 3   metFORMIN (GLUCOPHAGE-XR) 500 MG 24 hr tablet Take 1 tablet (500 mg total) by mouth daily with breakfast. 90 tablet 2   No facility-administered medications prior to visit.    No Known Allergies  ROS Review of Systems  Constitutional: Negative.   HENT: Negative.  Negative  for ear pain, postnasal drip, rhinorrhea, sinus pressure, sore throat, trouble swallowing and voice change.   Eyes: Negative.   Respiratory: Negative.  Negative for apnea, cough, choking, chest tightness, shortness of breath, wheezing and stridor.   Cardiovascular: Negative.  Negative for chest pain, palpitations and leg swelling.  Gastrointestinal: Negative.  Negative for abdominal distention, abdominal pain, nausea and vomiting.  Genitourinary: Negative.   Musculoskeletal: Negative.  Negative for arthralgias and myalgias.  Skin: Negative.  Negative for rash.  Allergic/Immunologic: Negative.  Negative for environmental allergies and food allergies.  Neurological: Negative.  Negative for dizziness, syncope, weakness and headaches.  Hematological: Negative.  Negative for adenopathy. Does not bruise/bleed easily.  Psychiatric/Behavioral: Negative.  Negative for agitation and sleep disturbance. The patient is not nervous/anxious.       Objective:    Physical Exam Vitals reviewed.  Constitutional:      Appearance: Normal appearance. She is well-developed and normal weight. She is not diaphoretic.  HENT:     Head: Normocephalic and atraumatic.     Nose: No nasal deformity, septal deviation, mucosal edema or rhinorrhea.     Right Sinus: No maxillary sinus tenderness or frontal sinus tenderness.     Left  Sinus: No maxillary sinus tenderness or frontal sinus tenderness.     Mouth/Throat:     Pharynx: No oropharyngeal exudate.     Comments: Multiple dental caries seen Eyes:     General: No scleral icterus.    Conjunctiva/sclera: Conjunctivae normal.     Pupils: Pupils are equal, round, and reactive to light.  Neck:     Thyroid: No thyromegaly.     Vascular: No carotid bruit or JVD.     Trachea: Trachea normal. No tracheal tenderness or tracheal deviation.  Cardiovascular:     Rate and Rhythm: Normal rate and regular rhythm.     Chest Wall: PMI is not displaced.     Pulses: Normal pulses. No decreased pulses.     Heart sounds: Normal heart sounds, S1 normal and S2 normal. Heart sounds not distant. No murmur heard.    No systolic murmur is present.     No diastolic murmur is present.     No friction rub. No gallop. No S3 or S4 sounds.  Pulmonary:     Effort: No tachypnea, accessory muscle usage or respiratory distress.     Breath sounds: No stridor. No decreased breath sounds, wheezing, rhonchi or rales.  Chest:     Chest wall: No tenderness.  Abdominal:     General: Bowel sounds are normal. There is no distension.     Palpations: Abdomen is soft. Abdomen is not rigid.     Tenderness: There is no abdominal tenderness. There is no guarding or rebound.  Musculoskeletal:        General: Normal range of motion.     Cervical back: Normal range of motion and neck supple. No edema, erythema or rigidity. No muscular tenderness. Normal range of motion.  Lymphadenopathy:     Head:     Right side of head: No submental or submandibular adenopathy.     Left side of head: No submental or submandibular adenopathy.     Cervical: No cervical adenopathy.  Skin:    General: Skin is warm and dry.     Coloration: Skin is not pale.     Findings: No lesion or rash.     Nails: There is no clubbing.  Neurological:     Mental Status: She is alert and  oriented to person, place, and time.     Sensory: No  sensory deficit.  Psychiatric:        Mood and Affect: Mood normal.        Speech: Speech normal.        Behavior: Behavior normal.        Thought Content: Thought content normal.     BP 129/73   Pulse 66   Wt 130 lb 12.8 oz (59.3 kg)   BMI 21.77 kg/m  Wt Readings from Last 3 Encounters:  08/02/22 130 lb 12.8 oz (59.3 kg)  07/19/22 129 lb 3.2 oz (58.6 kg)  02/22/22 139 lb 3.2 oz (63.1 kg)     There are no preventive care reminders to display for this patient.    There are no preventive care reminders to display for this patient.  Lab Results  Component Value Date   TSH 2.430 01/17/2019   Lab Results  Component Value Date   WBC 5.6 02/22/2022   HGB 12.1 02/22/2022   HCT 36.8 02/22/2022   MCV 89 02/22/2022   PLT 203 02/22/2022   Lab Results  Component Value Date   NA 140 02/22/2022   K 4.5 02/22/2022   CO2 24 02/22/2022   GLUCOSE 85 02/22/2022   BUN 23 02/22/2022   CREATININE 0.95 02/22/2022   BILITOT 0.3 02/22/2022   ALKPHOS 56 02/22/2022   AST 23 02/22/2022   ALT 18 02/22/2022   PROT 6.8 02/22/2022   ALBUMIN 4.7 02/22/2022   CALCIUM 9.7 02/22/2022   ANIONGAP 10 12/21/2018   EGFR 66 02/22/2022   Lab Results  Component Value Date   CHOL 144 02/22/2022   Lab Results  Component Value Date   HDL 66 02/22/2022   Lab Results  Component Value Date   LDLCALC 63 02/22/2022   Lab Results  Component Value Date   TRIG 80 02/22/2022   Lab Results  Component Value Date   CHOLHDL 2.2 02/22/2022  FINDINGS: AP LUMBAR SPINE (L1-L4)   Bone Mineral Density (BMD):  1.079 g/cm2   Young Adult T-Score:  0.3   Z-Score:  2.1   LEFT FEMUR NECK   Bone Mineral Density (BMD):  0.784 g/cm2   Young Adult T-Score: -0.6   Z-Score:  1.0   Unit: This study was performed at [facility] on the [place] (S/N [phone]), software version 13.4.2.   Scan quality: The scan quality is good. Exclusions: None.   ASSESSMENT: Patient's diagnostic  category is NORMAL by WHO Criteria.   FRACTURE RISK: NOT INCREASED.   FRAX: World Health Organization FRAX assessment of absolute fracture risk is not calculated for this patient because the patient has normal bone mineral density with all T-scores at or above -1.0.   COMPARISON: None.   RECOMMENDATIONS   1. All patients should optimize calcium and vitamin D intake.   2. Consider FDA-approved medical therapies in postmenopausal women and men aged 36 years and older, based on the following:   - A hip or vertebral (clinical or morphometric) fracture   - T-score less than or equal to -2.5 at the femoral neck or spine after appropriate evaluation to exclude secondary causes   - Low bone mass (T-score between -1.0 and -2.5 at the femoral neck or spine) and a 10-year probability of a hip fracture greater than or equal to 3% or a 10-year probability of a major osteoporosis-related fracture greater than or equal to 20% based on the [place]-adapted WHO algorithm   -  Clinician judgment and/or patient preferences may indicate treatment for people with 10-year fracture probabilities above or below these levels   3. Patients with diagnosis of osteoporosis or at high risk for fracture should have regular bone mineral density tests. For patients eligible for Medicare, routine testing is allowed once every 2 years. The testing frequency can be increased to one year for patients who have rapidly progressing disease, those who are receiving or discontinuing medical therapy to restore bone mass, or have additional risk factors.     Electronically Signed   By: Evangeline Dakin M.D.   On: 05/14/2021 07:40   Lab Results  Component Value Date   HGBA1C 6.0 (H) 02/22/2022      Assessment & Plan:   Problem List Items Addressed This Visit       Cardiovascular and Mediastinum   HTN (hypertension) - Primary    Hypertension well-controlled continue current medications and no need for further  secondary cause for hypertension evaluations     No orders of the defined types were placed in this encounter.   Follow-up: Return in about 3 months (around 11/02/2022) for htn.    Asencion Noble, MD

## 2022-08-02 ENCOUNTER — Ambulatory Visit: Payer: Medicare Other | Attending: Critical Care Medicine | Admitting: Critical Care Medicine

## 2022-08-02 ENCOUNTER — Encounter: Payer: Self-pay | Admitting: Critical Care Medicine

## 2022-08-02 VITALS — BP 129/73 | HR 66 | Wt 130.8 lb

## 2022-08-02 DIAGNOSIS — I10 Essential (primary) hypertension: Secondary | ICD-10-CM

## 2022-08-02 NOTE — Patient Instructions (Signed)
No changes   Call sooner if BP runs < 100/70  Return 15months

## 2022-08-02 NOTE — Assessment & Plan Note (Signed)
Hypertension well-controlled continue current medications and no need for further secondary cause for hypertension evaluations

## 2022-09-26 DIAGNOSIS — H04123 Dry eye syndrome of bilateral lacrimal glands: Secondary | ICD-10-CM | POA: Diagnosis not present

## 2022-09-26 DIAGNOSIS — H2513 Age-related nuclear cataract, bilateral: Secondary | ICD-10-CM | POA: Diagnosis not present

## 2022-09-26 DIAGNOSIS — H5203 Hypermetropia, bilateral: Secondary | ICD-10-CM | POA: Diagnosis not present

## 2022-11-03 ENCOUNTER — Ambulatory Visit
Admission: RE | Admit: 2022-11-03 | Discharge: 2022-11-03 | Disposition: A | Discharge status: Home / Self Care | Payer: Medicare Other | Source: Ambulatory Visit | Attending: Critical Care Medicine | Admitting: Critical Care Medicine

## 2022-11-03 DIAGNOSIS — Z1231 Encounter for screening mammogram for malignant neoplasm of breast: Secondary | ICD-10-CM

## 2022-11-08 ENCOUNTER — Other Ambulatory Visit: Payer: Self-pay | Admitting: Critical Care Medicine

## 2022-11-08 ENCOUNTER — Ambulatory Visit: Payer: Medicare Other | Admitting: Critical Care Medicine

## 2022-11-08 DIAGNOSIS — R928 Other abnormal and inconclusive findings on diagnostic imaging of breast: Secondary | ICD-10-CM

## 2022-11-08 NOTE — Progress Notes (Signed)
Let pt know her mammogram showed dense breasts and left breast with some difference in size, will need additional imaging the radiology dept will call with scheduling

## 2022-11-09 ENCOUNTER — Telehealth: Payer: Self-pay

## 2022-11-09 NOTE — Telephone Encounter (Signed)
Pt was called and vm was left, Information has been sent to nurse pool.   

## 2022-11-09 NOTE — Telephone Encounter (Signed)
-----   Message from Storm Frisk, MD sent at 11/08/2022  6:10 AM EDT ----- Let pt know her mammogram showed dense breasts and left breast with some difference in size, will need additional imaging the radiology dept will call with scheduling

## 2022-11-10 ENCOUNTER — Encounter: Payer: Self-pay | Admitting: Critical Care Medicine

## 2022-11-10 ENCOUNTER — Ambulatory Visit: Payer: Medicare Other | Attending: Critical Care Medicine | Admitting: Critical Care Medicine

## 2022-11-10 VITALS — BP 122/69 | HR 53 | Temp 98.1°F | Ht 65.0 in | Wt 131.0 lb

## 2022-11-10 DIAGNOSIS — E782 Mixed hyperlipidemia: Secondary | ICD-10-CM | POA: Diagnosis not present

## 2022-11-10 DIAGNOSIS — Z1231 Encounter for screening mammogram for malignant neoplasm of breast: Secondary | ICD-10-CM | POA: Diagnosis not present

## 2022-11-10 DIAGNOSIS — R7303 Prediabetes: Secondary | ICD-10-CM | POA: Diagnosis not present

## 2022-11-10 DIAGNOSIS — I10 Essential (primary) hypertension: Secondary | ICD-10-CM

## 2022-11-10 LAB — POCT GLYCOSYLATED HEMOGLOBIN (HGB A1C): HbA1c, POC (prediabetic range): 6.1 % (ref 5.7–6.4)

## 2022-11-10 LAB — GLUCOSE, POCT (MANUAL RESULT ENTRY): POC Glucose: 124 mg/dl — AB (ref 70–99)

## 2022-11-10 MED ORDER — METFORMIN HCL ER 500 MG PO TB24: 500.0000 mg | ORAL_TABLET | Freq: Every day | ORAL | 2 refills | Status: DC

## 2022-11-10 MED ORDER — LISINOPRIL-HYDROCHLOROTHIAZIDE 20-25 MG PO TABS: 1.0000 | ORAL_TABLET | Freq: Every day | ORAL | 3 refills | Status: DC

## 2022-11-10 MED ORDER — AMLODIPINE BESYLATE 10 MG PO TABS: 10.0000 mg | ORAL_TABLET | Freq: Every day | ORAL | 2 refills | Status: DC

## 2022-11-10 NOTE — Assessment & Plan Note (Signed)
Mammogram was abnormal with density on one of the breast is now getting diagnostic mammogram

## 2022-11-10 NOTE — Patient Instructions (Addendum)
Stop carvedilol Let us know if lips swell No other changes Refills sent to [facility] Return Dr Delford Field 4 months Lab: metabolic panel cholesterol To confirm cholesterol pill twice a week  Great work on your health!!!

## 2022-11-10 NOTE — Assessment & Plan Note (Signed)
Continue with metformin.  

## 2022-11-10 NOTE — Assessment & Plan Note (Signed)
Continue with statin recheck lipid 

## 2022-11-10 NOTE — Progress Notes (Signed)
Established Patient Office Visit  Subjective:  Patient ID: Debbie Barnett, female    DOB: 1956/02/22  Age: 67 y.o. MRN: [id number]  CC: Primary care follow-up visit:    HPI 04/2021 Laysa Kimmey presents for primary care follow-up.  Patient is due a flu vaccine and pneumonia vaccine she agrees to receive both at this visit.  She would also like a referral back to ophthalmology for another eye exam.  Patient's continues to try to lose weight she is down to a BMI of 27.3.  She is interested in joining a fitness center and is can be looking at options that may be covered under her new Medicare advantage insurance plan.  On arrival blood pressure 128/84.  She is fully vaccinated against COVID.  She has no other real complaints at this time.  She does have an upcoming DEXA scan scheduled for January  The patient is due the second zoster and  09/20/21 Patient returns in follow-up.  Since the last office visit she had a bone DEXA scan that was normal.  Blood pressure on arrival is good 128/85.  She maintains Zestoretic and needs refills.  She is trying to eat a healthier diet.  She does need a follow-up lipid panel and health screening lab work.  She has skin tags under the left eye and under the left breast she would like removed.  Patient does have severe dental disease with multiple dental caries and is planning dental visit soon.  Patient had a mammogram in December which showed some bone densities was hard to read but largely was negative for malignancy.  They are recommending repeat study in 1 year not 2 years.  Cologuard study in 2022 was negative repeat is going to be 2025.  Patient does not have any other real complaints at this visit.  She is due a second shingles vaccine and agrees to receive it at our office.  10/10 Patient seen in follow-up she has lost 30 pounds in weight following a lifestyle medicine approach.  She is off all sugary substances and eating a plant-based diet.  Blood  pressure on arrival 115/75.  This is a dramatic improvement.  She is on low-dose lisinopril HCTZ.  Also has hyperlipidemia we will need to follow-up lipid panel.  She has a follow-up mammogram for December of this year.  She has no other primary care gaps and she did agree to receive the flu vaccine this visit  07/19/22 Patient is seen in return follow-up blood pressure on arrival is 200/100 repeat is continues to be elevated.  She has had normal blood pressures in the past.  She notes progressive increased and night sweats which are soaking in nature.  She has never had pheochromocytoma evaluation performed.  Blood pressure in the last few visits has been normal and she has been taking the lisinopril 20 mg daily.  She has prediabetes on metformin and hyperlipidemia on atorvastatin.  11/10/22 Patient seen in return follow-up doing quite well she is lost 40 pounds of weight blood pressure is excellent and there is no complaints today.  She maintains amlodipine lisinopril HCT and carvedilol.  She has noted some lower blood pressure readings at home.  She works in a [employer] and occasionally gets dehydrated takes fluids.  She has prediabetes on metformin.  She has hyperlipidemia on atorvastatin. Past Medical History:  Diagnosis Date   Blood transfusion without reported diagnosis    ~1996 with gastroenteritis/food poisoning   Chronic hepatitis  C (HCC) 01/24/2019   Chronic hepatitis C without hepatic coma (HCC) 01/18/2019   Hypertension     Past Surgical History:  Procedure Laterality Date   ABDOMINAL HYSTERECTOMY      Family History  Problem Relation Age of Onset   Kidney disease Mother    Heart disease Father    Obesity Sister    Heart disease Brother    Hypertension Brother    Alcohol abuse Maternal Grandfather     Social History   Socioeconomic History   Marital status: Single    Spouse name: Not on file   Number of children: Not on file   Years of education: Not on file    Highest education level: Not on file  Occupational History   Occupation: Fish farm manager  Tobacco Use   Smoking status: Former    Packs/day: 1    Types: Cigarettes    Quit date: 2013    Years since quitting: 11.4   Smokeless tobacco: Never  Substance and Sexual Activity   Alcohol use: Yes    Comment: rare, approximately twice a year   Drug use: Not Currently    Types: Cocaine, Marijuana    Comment: remote IVDU once in late 1970s   Sexual activity: Yes    Birth control/protection: Condom  Other Topics Concern   Not on file  Social History Narrative   Not on file   Social Determinants of Health   Financial Resource Strain: Low Risk  (02/25/2022)   Overall Financial Resource Strain (CARDIA)    Difficulty of Paying Living Expenses: Not hard at all  Food Insecurity: No Food Insecurity (02/25/2022)   Hunger Vital Sign    Worried About Running Out of Food in the Last Year: Never true    Ran Out of Food in the Last Year: Never true  Transportation Needs: No Transportation Needs (02/25/2022)   PRAPARE - Administrator, Civil Service (Medical): No    Lack of Transportation (Non-Medical): No  Physical Activity: Sufficiently Active (02/25/2022)   Exercise Vital Sign    Days of Exercise per Week: 5 days    Minutes of Exercise per Session: 120 min  Stress: No Stress Concern Present (02/25/2022)   [employer] of Occupational Health - Occupational Stress Questionnaire    Feeling of Stress : Not at all  Social Connections: Socially Isolated (02/25/2022)   Social Connection and Isolation Panel [NHANES]    Frequency of Communication with Friends and Family: Three times a week    Frequency of Social Gatherings with Friends and Family: More than three times a week    Attends Religious Services: Never    Database administrator or Organizations: No    Attends Banker Meetings: Never    Marital Status: Divorced  Catering manager Violence: Not on file     Outpatient Medications Prior to Visit  Medication Sig Dispense Refill   atorvastatin (LIPITOR) 10 MG tablet Take 1 tablet by mouth once daily (Patient taking differently: Take 10 mg by mouth 2 (two) times a week. Take 1 tablet by mouth once daily) 90 tablet 0   amLODipine (NORVASC) 10 MG tablet Take 1 tablet (10 mg total) by mouth daily. 90 tablet 2   carvedilol (COREG) 12.5 MG tablet Take 1 tablet (12.5 mg total) by mouth 2 (two) times daily with a meal. 60 tablet 3   lisinopril-hydrochlorothiazide (ZESTORETIC) 20-25 MG tablet Take 1 tablet by mouth daily. 90 tablet 3   metFORMIN (GLUCOPHAGE-XR)  500 MG 24 hr tablet Take 1 tablet (500 mg total) by mouth daily with breakfast. 90 tablet 2   No facility-administered medications prior to visit.    No Known Allergies  ROS Review of Systems  Constitutional: Negative.   HENT: Negative.  Negative for ear pain, postnasal drip, rhinorrhea, sinus pressure, sore throat, trouble swallowing and voice change.   Eyes: Negative.   Respiratory: Negative.  Negative for apnea, cough, choking, chest tightness, shortness of breath, wheezing and stridor.   Cardiovascular: Negative.  Negative for chest pain, palpitations and leg swelling.  Gastrointestinal: Negative.  Negative for abdominal distention, abdominal pain, nausea and vomiting.  Genitourinary: Negative.   Musculoskeletal: Negative.  Negative for arthralgias and myalgias.  Skin: Negative.  Negative for rash.  Allergic/Immunologic: Negative.  Negative for environmental allergies and food allergies.  Neurological: Negative.  Negative for dizziness, syncope, weakness and headaches.  Hematological: Negative.  Negative for adenopathy. Does not bruise/bleed easily.  Psychiatric/Behavioral: Negative.  Negative for agitation and sleep disturbance. The patient is not nervous/anxious.       Objective:    Physical Exam Vitals reviewed.  Constitutional:      Appearance: Normal appearance. She is  well-developed and normal weight. She is not diaphoretic.  HENT:     Head: Normocephalic and atraumatic.     Nose: No nasal deformity, septal deviation, mucosal edema or rhinorrhea.     Right Sinus: No maxillary sinus tenderness or frontal sinus tenderness.     Left Sinus: No maxillary sinus tenderness or frontal sinus tenderness.     Mouth/Throat:     Pharynx: No oropharyngeal exudate.     Comments: Multiple dental caries seen Eyes:     General: No scleral icterus.    Conjunctiva/sclera: Conjunctivae normal.     Pupils: Pupils are equal, round, and reactive to light.  Neck:     Thyroid: No thyromegaly.     Vascular: No carotid bruit or JVD.     Trachea: Trachea normal. No tracheal tenderness or tracheal deviation.  Cardiovascular:     Rate and Rhythm: Normal rate and regular rhythm.     Chest Wall: PMI is not displaced.     Pulses: Normal pulses. No decreased pulses.     Heart sounds: Normal heart sounds, S1 normal and S2 normal. Heart sounds not distant. No murmur heard.    No systolic murmur is present.     No diastolic murmur is present.     No friction rub. No gallop. No S3 or S4 sounds.  Pulmonary:     Effort: No tachypnea, accessory muscle usage or respiratory distress.     Breath sounds: No stridor. No decreased breath sounds, wheezing, rhonchi or rales.  Chest:     Chest wall: No tenderness.  Abdominal:     General: Bowel sounds are normal. There is no distension.     Palpations: Abdomen is soft. Abdomen is not rigid.     Tenderness: There is no abdominal tenderness. There is no guarding or rebound.  Musculoskeletal:        General: Normal range of motion.     Cervical back: Normal range of motion and neck supple. No edema, erythema or rigidity. No muscular tenderness. Normal range of motion.  Lymphadenopathy:     Head:     Right side of head: No submental or submandibular adenopathy.     Left side of head: No submental or submandibular adenopathy.     Cervical: No  cervical adenopathy.  Skin:  General: Skin is warm and dry.     Coloration: Skin is not pale.     Findings: No lesion or rash.     Nails: There is no clubbing.  Neurological:     Mental Status: She is alert and oriented to person, place, and time.     Sensory: No sensory deficit.  Psychiatric:        Mood and Affect: Mood normal.        Speech: Speech normal.        Behavior: Behavior normal.        Thought Content: Thought content normal.     BP 122/69 (BP Location: Left Arm, Patient Position: Sitting, Cuff Size: Normal)   Pulse (!) 53   Temp 98.1 F (36.7 C) (Oral)   Ht 5\' 5"  (1.651 m)   Wt 131 lb (59.4 kg)   SpO2 100%   BMI 21.80 kg/m  Wt Readings from Last 3 Encounters:  11/10/22 131 lb (59.4 kg)  08/02/22 130 lb 12.8 oz (59.3 kg)  07/19/22 129 lb 3.2 oz (58.6 kg)     There are no preventive care reminders to display for this patient.    There are no preventive care reminders to display for this patient.  Lab Results  Component Value Date   TSH 2.430 01/17/2019   Lab Results  Component Value Date   WBC 5.6 02/22/2022   HGB 12.1 02/22/2022   HCT 36.8 02/22/2022   MCV 89 02/22/2022   PLT 203 02/22/2022   Lab Results  Component Value Date   NA 140 02/22/2022   K 4.5 02/22/2022   CO2 24 02/22/2022   GLUCOSE 85 02/22/2022   BUN 23 02/22/2022   CREATININE 0.95 02/22/2022   BILITOT 0.3 02/22/2022   ALKPHOS 56 02/22/2022   AST 23 02/22/2022   ALT 18 02/22/2022   PROT 6.8 02/22/2022   ALBUMIN 4.7 02/22/2022   CALCIUM 9.7 02/22/2022   ANIONGAP 10 12/21/2018   EGFR 66 02/22/2022   Lab Results  Component Value Date   CHOL 144 02/22/2022   Lab Results  Component Value Date   HDL 66 02/22/2022   Lab Results  Component Value Date   LDLCALC 63 02/22/2022   Lab Results  Component Value Date   TRIG 80 02/22/2022   Lab Results  Component Value Date   CHOLHDL 2.2 02/22/2022  FINDINGS: AP LUMBAR SPINE (L1-L4)   Bone Mineral Density (BMD):   1.079 g/cm2   Young Adult T-Score:  0.3   Z-Score:  2.1   LEFT FEMUR NECK   Bone Mineral Density (BMD):  0.784 g/cm2   Young Adult T-Score: -0.6   Z-Score:  1.0   Unit: This study was performed at [facility] on the [employer] (S/N [phone]), software version 13.4.2.   Scan quality: The scan quality is good. Exclusions: None.   ASSESSMENT: Patient's diagnostic category is NORMAL by WHO Criteria.   FRACTURE RISK: NOT INCREASED.   FRAX: World Health Organization FRAX assessment of absolute fracture risk is not calculated for this patient because the patient has normal bone mineral density with all T-scores at or above -1.0.   COMPARISON: None.   RECOMMENDATIONS   1. All patients should optimize calcium and vitamin D intake.   2. Consider FDA-approved medical therapies in postmenopausal women and men aged 20 years and older, based on the following:   - A hip or vertebral (clinical or morphometric) fracture   - T-score less than or  equal to -2.5 at the femoral neck or spine after appropriate evaluation to exclude secondary causes   - Low bone mass (T-score between -1.0 and -2.5 at the femoral neck or spine) and a 10-year probability of a hip fracture greater than or equal to 3% or a 10-year probability of a major osteoporosis-related fracture greater than or equal to 20% based on the [place]-adapted WHO algorithm   - Clinician judgment and/or patient preferences may indicate treatment for people with 10-year fracture probabilities above or below these levels   3. Patients with diagnosis of osteoporosis or at high risk for fracture should have regular bone mineral density tests. For patients eligible for Medicare, routine testing is allowed once every 2 years. The testing frequency can be increased to one year for patients who have rapidly progressing disease, those who are receiving or discontinuing medical therapy to restore bone mass, or have additional  risk factors.     Electronically Signed   By: Hulan Saas M.D.   On: 05/14/2021 07:40   Lab Results  Component Value Date   HGBA1C 6.1 11/10/2022      Assessment & Plan:   Problem List Items Addressed This Visit       Cardiovascular and Mediastinum   HTN (hypertension)    Likely overtreated we will discontinue carvedilol maintain lisinopril HCT and amlodipine check labs      Relevant Medications   amLODipine (NORVASC) 10 MG tablet   lisinopril-hydrochlorothiazide (ZESTORETIC) 20-25 MG tablet   Other Relevant Orders   Comprehensive metabolic panel     Other   Hyperlipidemia    Continue with statin recheck lipid      Relevant Medications   amLODipine (NORVASC) 10 MG tablet   lisinopril-hydrochlorothiazide (ZESTORETIC) 20-25 MG tablet   Other Relevant Orders   Lipid panel   Encounter for screening mammogram for malignant neoplasm of breast    Mammogram was abnormal with density on one of the breast is now getting diagnostic mammogram      Prediabetes - Primary    Continue with metformin      Relevant Orders   POCT glucose (manual entry) (Completed)   POCT glycosylated hemoglobin (Hb A1C) (Completed)   Comprehensive metabolic panel   Meds ordered this encounter  Medications   amLODipine (NORVASC) 10 MG tablet    Sig: Take 1 tablet (10 mg total) by mouth daily.    Dispense:  90 tablet    Refill:  2   lisinopril-hydrochlorothiazide (ZESTORETIC) 20-25 MG tablet    Sig: Take 1 tablet by mouth daily.    Dispense:  90 tablet    Refill:  3   metFORMIN (GLUCOPHAGE-XR) 500 MG 24 hr tablet    Sig: Take 1 tablet (500 mg total) by mouth daily with breakfast.    Dispense:  90 tablet    Refill:  2    Follow-up: Return in about 4 months (around 03/12/2023) for htn.    Shan Levans, MD

## 2022-11-10 NOTE — Assessment & Plan Note (Signed)
Likely overtreated we will discontinue carvedilol maintain lisinopril HCT and amlodipine check labs

## 2022-11-11 ENCOUNTER — Other Ambulatory Visit: Payer: Self-pay | Admitting: Critical Care Medicine

## 2022-11-11 ENCOUNTER — Telehealth: Payer: Self-pay | Admitting: Critical Care Medicine

## 2022-11-11 ENCOUNTER — Telehealth: Payer: Self-pay

## 2022-11-11 LAB — LIPID PANEL
Chol/HDL Ratio: 2.4 ratio (ref 0.0–4.4)
Cholesterol, Total: 204 mg/dL — ABNORMAL HIGH (ref 100–199)
HDL: 84 mg/dL (ref >39)
LDL Chol Calc (NIH): 100 mg/dL — ABNORMAL HIGH (ref 0–99)
Triglycerides: 116 mg/dL (ref 0–149)
VLDL Cholesterol Cal: 20 mg/dL (ref 5–40)

## 2022-11-11 LAB — COMPREHENSIVE METABOLIC PANEL
ALT: 25 IU/L (ref 0–32)
AST: 25 IU/L (ref 0–40)
Albumin: 4.7 g/dL (ref 3.9–4.9)
Alkaline Phosphatase: 57 IU/L (ref 44–121)
BUN/Creatinine Ratio: 31 — ABNORMAL HIGH (ref 12–28)
BUN: 30 mg/dL — ABNORMAL HIGH (ref 8–27)
Bilirubin Total: 0.3 mg/dL (ref 0.0–1.2)
CO2: 23 mmol/L (ref 20–29)
Calcium: 9.9 mg/dL (ref 8.7–10.3)
Chloride: 100 mmol/L (ref 96–106)
Creatinine, Ser: 0.96 mg/dL (ref 0.57–1.00)
Globulin, Total: 2.3 g/dL (ref 1.5–4.5)
Glucose: 103 mg/dL — ABNORMAL HIGH (ref 70–99)
Potassium: 4.7 mmol/L (ref 3.5–5.2)
Sodium: 138 mmol/L (ref 134–144)
Total Protein: 7 g/dL (ref 6.0–8.5)
eGFR: 65 mL/min/{1.73_m2} (ref >59)

## 2022-11-11 MED ORDER — ATORVASTATIN CALCIUM 10 MG PO TABS: ORAL_TABLET | ORAL | 2 refills | Status: DC

## 2022-11-11 NOTE — Telephone Encounter (Signed)
Patient and I spoke over the phone regarding labs, wanting clarification  on the atorvastatin

## 2022-11-11 NOTE — Progress Notes (Signed)
Let pt know kidney normal, liver normal , cholesterol has increased, take atorvastatin 4 times a week

## 2022-11-11 NOTE — Telephone Encounter (Signed)
-----   Message from Storm Frisk, MD sent at 11/11/2022  6:55 AM EDT ----- Let pt know kidney normal, liver normal , cholesterol has increased, take atorvastatin 4 times a week

## 2022-11-11 NOTE — Telephone Encounter (Signed)
Pt was called and is aware of results, DOB was confirmed.  ?

## 2022-11-15 ENCOUNTER — Ambulatory Visit
Admission: RE | Admit: 2022-11-15 | Discharge: 2022-11-15 | Disposition: A | Discharge status: Home / Self Care | Payer: Medicare Other | Source: Ambulatory Visit | Attending: Critical Care Medicine | Admitting: Critical Care Medicine

## 2022-11-15 ENCOUNTER — Ambulatory Visit: Payer: Medicare Other

## 2022-11-15 DIAGNOSIS — R928 Other abnormal and inconclusive findings on diagnostic imaging of breast: Secondary | ICD-10-CM | POA: Diagnosis not present

## 2022-11-16 ENCOUNTER — Telehealth: Payer: Self-pay

## 2022-11-16 NOTE — Telephone Encounter (Signed)
Patient was already aware of results. 

## 2022-11-29 ENCOUNTER — Ambulatory Visit: Payer: Medicare Other | Attending: Critical Care Medicine

## 2022-11-29 VITALS — Ht 65.0 in | Wt 131.0 lb

## 2022-11-29 DIAGNOSIS — Z0000 Encounter for general adult medical examination without abnormal findings: Secondary | ICD-10-CM

## 2022-11-29 NOTE — Patient Instructions (Addendum)
[name] , Thank you for taking time to come for your Medicare Wellness Visit. I appreciate your ongoing commitment to your health goals. Please review the following plan we discussed and let me know if I can assist you in the future.   These are the goals we discussed:  Goals      Remain active and independent        This is a list of the screening recommended for you and due dates:  Health Maintenance  Topic Date Due   Flu Shot  12/15/2022   Medicare Annual Wellness Visit  11/29/2023   Cologuard (Stool DNA test)  12/01/2023   Mammogram  11/02/2024   DTaP/Tdap/Td vaccine (2 - Td or Tdap) 01/16/2029   Pneumonia Vaccine  Completed   DEXA scan (bone density measurement)  Completed   Hepatitis C Screening  Completed   Zoster (Shingles) Vaccine  Completed   HPV Vaccine  Aged Out   COVID-19 Vaccine  Discontinued    Advanced directives: Information on Advanced Care Planning can be found at [facility] Advance Health Care Directives Advance Health Care Directives ([email or web address] Please bring a copy of your health care power of attorney and living will to the office to be added to your chart at your convenience.  Conditions/risks identified: Aim for 30 minutes of exercise or brisk walking, 6-8 glasses of water, and 5 servings of fruits and vegetables each day.  Next appointment: Follow up in one year for your annual wellness visit    Preventive Care 65 Years and Older, Female Preventive care refers to lifestyle choices and visits with your health care provider that can promote health and wellness. What does preventive care include? A yearly physical exam. This is also called an annual well check. Dental exams once or twice a year. Routine eye exams. Ask your health care provider how often you should have your eyes checked. Personal lifestyle choices, including: Daily care of your teeth and gums. Regular physical activity. Eating a healthy diet. Avoiding tobacco  and drug use. Limiting alcohol use. Practicing safe sex. Taking low-dose aspirin every day. Taking vitamin and mineral supplements as recommended by your health care provider. What happens during an annual well check? The services and screenings done by your health care provider during your annual well check will depend on your age, overall health, lifestyle risk factors, and family history of disease. Counseling  Your health care provider may ask you questions about your: Alcohol use. Tobacco use. Drug use. Emotional well-being. Home and relationship well-being. Sexual activity. Eating habits. History of falls. Memory and ability to understand (cognition). Work and work Astronomer. Reproductive health. Screening  You may have the following tests or measurements: Height, weight, and BMI. Blood pressure. Lipid and cholesterol levels. These may be checked every 5 years, or more frequently if you are over 47 years old. Skin check. Lung cancer screening. You may have this screening every year starting at age 65 if you have a 30-pack-year history of smoking and currently smoke or have quit within the past 15 years. Fecal occult blood test (FOBT) of the stool. You may have this test every year starting at age 22. Flexible sigmoidoscopy or colonoscopy. You may have a sigmoidoscopy every 5 years or a colonoscopy every 10 years starting at age 41. Hepatitis C blood test. Hepatitis B blood test. Sexually transmitted disease (STD) testing. Diabetes screening. This is done by checking your blood sugar (glucose) after you have not eaten for  a while (fasting). You may have this done every 1-3 years. Bone density scan. This is done to screen for osteoporosis. You may have this done starting at age 43. Mammogram. This may be done every 1-2 years. Talk to your health care provider about how often you should have regular mammograms. Talk with your health care provider about your test results,  treatment options, and if necessary, the need for more tests. Vaccines  Your health care provider may recommend certain vaccines, such as: Influenza vaccine. This is recommended every year. Tetanus, diphtheria, and acellular pertussis (Tdap, Td) vaccine. You may need a Td booster every 10 years. Zoster vaccine. You may need this after age 26. Pneumococcal 13-valent conjugate (PCV13) vaccine. One dose is recommended after age 85. Pneumococcal polysaccharide (PPSV23) vaccine. One dose is recommended after age 61. Talk to your health care provider about which screenings and vaccines you need and how often you need them. This information is not intended to replace advice given to you by your health care provider. Make sure you discuss any questions you have with your health care provider. Document Released: 05/29/2015 Document Revised: 01/20/2016 Document Reviewed: 03/03/2015 Elsevier Interactive Patient Education  2017 [employer].  Fall Prevention in the Home Falls can cause injuries. They can happen to people of all ages. There are many things you can do to make your home safe and to help prevent falls. What can I do on the outside of my home? Regularly fix the edges of walkways and driveways and fix any cracks. Remove anything that might make you trip as you walk through a door, such as a raised step or threshold. Trim any bushes or trees on the path to your home. Use bright outdoor lighting. Clear any walking paths of anything that might make someone trip, such as rocks or tools. Regularly check to see if handrails are loose or broken. Make sure that both sides of any steps have handrails. Any raised decks and porches should have guardrails on the edges. Have any leaves, snow, or ice cleared regularly. Use sand or salt on walking paths during winter. Clean up any spills in your garage right away. This includes oil or grease spills. What can I do in the bathroom? Use night  lights. Install grab bars by the toilet and in the tub and shower. Do not use towel bars as grab bars. Use non-skid mats or decals in the tub or shower. If you need to sit down in the shower, use a plastic, non-slip stool. Keep the floor dry. Clean up any water that spills on the floor as soon as it happens. Remove soap buildup in the tub or shower regularly. Attach bath mats securely with double-sided non-slip rug tape. Do not have throw rugs and other things on the floor that can make you trip. What can I do in the bedroom? Use night lights. Make sure that you have a light by your bed that is easy to reach. Do not use any sheets or blankets that are too big for your bed. They should not hang down onto the floor. Have a firm chair that has side arms. You can use this for support while you get dressed. Do not have throw rugs and other things on the floor that can make you trip. What can I do in the kitchen? Clean up any spills right away. Avoid walking on wet floors. Keep items that you use a lot in easy-to-reach places. If you need to reach something above  you, use a strong step stool that has a grab bar. Keep electrical cords out of the way. Do not use floor polish or wax that makes floors slippery. If you must use wax, use non-skid floor wax. Do not have throw rugs and other things on the floor that can make you trip. What can I do with my stairs? Do not leave any items on the stairs. Make sure that there are handrails on both sides of the stairs and use them. Fix handrails that are broken or loose. Make sure that handrails are as long as the stairways. Check any carpeting to make sure that it is firmly attached to the stairs. Fix any carpet that is loose or worn. Avoid having throw rugs at the top or bottom of the stairs. If you do have throw rugs, attach them to the floor with carpet tape. Make sure that you have a light switch at the top of the stairs and the bottom of the stairs. If  you do not have them, ask someone to add them for you. What else can I do to help prevent falls? Wear shoes that: Do not have high heels. Have rubber bottoms. Are comfortable and fit you well. Are closed at the toe. Do not wear sandals. If you use a stepladder: Make sure that it is fully opened. Do not climb a closed stepladder. Make sure that both sides of the stepladder are locked into place. Ask someone to hold it for you, if possible. Clearly mark and make sure that you can see: Any grab bars or handrails. First and last steps. Where the edge of each step is. Use tools that help you move around (mobility aids) if they are needed. These include: Canes. Walkers. Scooters. Crutches. Turn on the lights when you go into a dark area. Replace any light bulbs as soon as they burn out. Set up your furniture so you have a clear path. Avoid moving your furniture around. If any of your floors are uneven, fix them. If there are any pets around you, be aware of where they are. Review your medicines with your doctor. Some medicines can make you feel dizzy. This can increase your chance of falling. Ask your doctor what other things that you can do to help prevent falls. This information is not intended to replace advice given to you by your health care provider. Make sure you discuss any questions you have with your health care provider. Document Released: 02/26/2009 Document Revised: 10/08/2015 Document Reviewed: 06/06/2014 Elsevier Interactive Patient Education  2017 [employer].

## 2022-11-29 NOTE — Progress Notes (Addendum)
Subjective:   Debbie Barnett is a 67 y.o. female who presents for Medicare Annual (Subsequent) preventive examination.  Visit Complete: Virtual  I connected with  Alois Cliche on 11/29/22 by a audio enabled telemedicine application and verified that I am speaking with the correct person using two identifiers.  Patient Location: Home  Provider Location: Home Office  I discussed the limitations of evaluation and management by telemedicine. The patient expressed understanding and agreed to proceed.  Review of Systems     Cardiac Risk Factors include: advanced age (>24men, >5 women);hypertension;dyslipidemia  Per patient no change in vitals since last visit, unable to obtain new vitals due to telehealth visit     Objective:    Today's Vitals   11/29/22 1434  Weight: 131 lb (59.4 kg)  Height: 5\' 5"  (1.651 m)   Body mass index is 21.8 kg/m.     11/29/2022    2:40 PM 02/25/2022    4:02 PM 12/21/2018   12:19 PM  Advanced Directives  Does Patient Have a Medical Advance Directive? No No;Yes No  Would patient like information on creating a medical advance directive? Yes (MAU/Ambulatory/Procedural Areas - Information given)  No - Patient declined    Current Medications (verified) Outpatient Encounter Medications as of 11/29/2022  Medication Sig   amLODipine (NORVASC) 10 MG tablet Take 1 tablet (10 mg total) by mouth daily.   atorvastatin (LIPITOR) 10 MG tablet Take 4 times a week   lisinopril-hydrochlorothiazide (ZESTORETIC) 20-25 MG tablet Take 1 tablet by mouth daily.   metFORMIN (GLUCOPHAGE-XR) 500 MG 24 hr tablet Take 1 tablet (500 mg total) by mouth daily with breakfast.   No facility-administered encounter medications on file as of 11/29/2022.    Allergies (verified) Patient has no known allergies.   History: Past Medical History:  Diagnosis Date   Blood transfusion without reported diagnosis    ~1996 with gastroenteritis/food poisoning   Chronic hepatitis C  (HCC) 01/24/2019   Chronic hepatitis C without hepatic coma (HCC) 01/18/2019   Hypertension    Past Surgical History:  Procedure Laterality Date   ABDOMINAL HYSTERECTOMY     Family History  Problem Relation Age of Onset   Kidney disease Mother    Heart disease Father    Obesity Sister    Heart disease Brother    Hypertension Brother    Alcohol abuse Maternal Grandfather    Social History   Socioeconomic History   Marital status: Single    Spouse name: Not on file   Number of children: Not on file   Years of education: Not on file   Highest education level: Not on file  Occupational History   Occupation: Fish farm manager  Tobacco Use   Smoking status: Former    Current packs/day: 0.00    Types: Cigarettes    Quit date: 2013    Years since quitting: 11.5   Smokeless tobacco: Never  Substance and Sexual Activity   Alcohol use: Yes    Comment: rare, approximately twice a year   Drug use: Not Currently    Types: Cocaine, Marijuana    Comment: remote IVDU once in late 1970s   Sexual activity: Yes    Birth control/protection: Condom  Other Topics Concern   Not on file  Social History Narrative   Not on file   Social Determinants of Health   Financial Resource Strain: Low Risk  (11/29/2022)   Overall Financial Resource Strain (CARDIA)    Difficulty of Paying  Living Expenses: Not hard at all  Food Insecurity: No Food Insecurity (11/29/2022)   Hunger Vital Sign    Worried About Running Out of Food in the Last Year: Never true    Ran Out of Food in the Last Year: Never true  Transportation Needs: No Transportation Needs (11/29/2022)   PRAPARE - Administrator, Civil Service (Medical): No    Lack of Transportation (Non-Medical): No  Physical Activity: Sufficiently Active (11/29/2022)   Exercise Vital Sign    Days of Exercise per Week: 5 days    Minutes of Exercise per Session: 60 min  Stress: No Stress Concern Present (11/29/2022)   [employer] of  Occupational Health - Occupational Stress Questionnaire    Feeling of Stress : Not at all  Social Connections: Socially Isolated (11/29/2022)   Social Connection and Isolation Panel [NHANES]    Frequency of Communication with Friends and Family: More than three times a week    Frequency of Social Gatherings with Friends and Family: Three times a week    Attends Religious Services: Never    Active Member of Clubs or Organizations: No    Attends Engineer, structural: Never    Marital Status: Divorced    Tobacco Counseling Counseling given: Not Answered   Clinical Intake:  Pre-visit preparation completed: Yes  Pain : No/denies pain     Diabetes: No  How often do you need to have someone help you when you read instructions, pamphlets, or other written materials from your doctor or pharmacy?: 1 - Never  Interpreter Needed?: No  Information entered by :: Kandis Fantasia LPN   Activities of Daily Living    11/29/2022    2:39 PM 02/25/2022    4:06 PM  In your present state of health, do you have any difficulty performing the following activities:  Hearing? 0 0  Vision? 0 0  Difficulty concentrating or making decisions? 0 0  Walking or climbing stairs? 0 0  Dressing or bathing? 0 0  Doing errands, shopping? 0 0  Preparing Food and eating ? N N  Using the Toilet? N N  In the past six months, have you accidently leaked urine? N N  Do you have problems with loss of bowel control? N N  Managing your Medications? N N  Managing your Finances? N N  Housekeeping or managing your Housekeeping? N N    Patient Care Team: Storm Frisk, MD as PCP - General (Pulmonary Disease) Antony Contras, MD as Consulting Physician (Ophthalmology)  Indicate any recent Medical Services you may have received from other than [facility] providers in the past year (date may be approximate).     Assessment:   This is a routine wellness examination for [facility].  Hearing/Vision screen Hearing  Screening - Comments:: Denies hearing difficulties   Vision Screening - Comments:: Wears rx glasses - up to date with routine eye exams with Dr. Randon Goldsmith    Dietary issues and exercise activities discussed:     Goals Addressed             This Visit's Progress    Remain active and independent         Depression Screen    11/29/2022    2:38 PM 11/10/2022    9:05 AM 08/02/2022    3:21 PM 07/19/2022    4:15 PM 02/22/2022    3:05 PM 09/20/2021    3:03 PM 04/20/2021    3:07 PM  PHQ 2/9 Scores  PHQ - 2 Score 0 0 0 0 0 0 0  PHQ- 9 Score 0 0     0    Fall Risk    11/29/2022    2:39 PM 11/10/2022    9:00 AM 08/02/2022    3:21 PM 07/19/2022    4:15 PM 02/25/2022    4:02 PM  Fall Risk   Falls in the past year? 0 0 0 0 0  Number falls in past yr: 0 0 0 0 0  Injury with Fall? 0 0 0 0 0  Risk for fall due to : No Fall Risks No Fall Risks No Fall Risks No Fall Risks No Fall Risks  Follow up Falls prevention discussed;Education provided;Falls evaluation completed        MEDICARE RISK AT HOME:  Medicare Risk at Home - 11/29/22 1439     Any stairs in or around the home? No    If so, are there any without handrails? No    Home free of loose throw rugs in walkways, pet beds, electrical cords, etc? Yes    Adequate lighting in your home to reduce risk of falls? Yes    Life alert? No    Use of a cane, walker or w/c? No    Grab bars in the bathroom? Yes    Shower chair or bench in shower? No    Elevated toilet seat or a handicapped toilet? No             TIMED UP AND GO:  Was the test performed?  No    Cognitive Function:    02/25/2022    4:03 PM 04/07/2020    8:53 AM  MMSE - Mini Mental State Exam  Orientation to time 5 5  Orientation to Place 5 5  Registration 3 3  Attention/ Calculation 5 5  Recall 3 3  Language- name 2 objects 2 2  Language- repeat 1 1  Language- follow 3 step command 3 3  Language- read & follow direction 1 1  Write a sentence 1 1  Copy design 1 1   Total score 30 30        11/29/2022    2:40 PM  6CIT Screen  What Year? 0 points  What month? 0 points  What time? 0 points  Count back from 20 0 points  Months in reverse 0 points  Repeat phrase 0 points  Total Score 0 points    Immunizations Immunization History  Administered Date(s) Administered   Influenza,inj,Quad PF,6+ Mos 01/17/2019, 04/07/2020, 04/20/2021, 02/22/2022   PFIZER(Purple Top)SARS-COV-2 Vaccination 09/21/2019, 10/15/2019, 04/21/2020, 12/06/2020   PNEUMOCOCCAL CONJUGATE-20 04/20/2021   Pneumococcal Conjugate-13 04/07/2020   Tdap 01/17/2019   Zoster Recombinant(Shingrix) 11/23/2020, 09/20/2021    TDAP status: Up to date  Pneumococcal vaccine status: Up to date  Covid-19 vaccine status: Information provided on how to obtain vaccines.   Qualifies for Shingles Vaccine? Yes   Zostavax completed No   Shingrix Completed?: Yes  Screening Tests Health Maintenance  Topic Date Due   INFLUENZA VACCINE  12/15/2022   Medicare Annual Wellness (AWV)  11/29/2023   Fecal DNA (Cologuard)  12/01/2023   MAMMOGRAM  11/02/2024   DTaP/Tdap/Td (2 - Td or Tdap) 01/16/2029   Pneumonia Vaccine 59+ Years old  Completed   DEXA SCAN  Completed   Hepatitis C Screening  Completed   Zoster Vaccines- Shingrix  Completed   HPV VACCINES  Aged Out   COVID-19 Vaccine  Discontinued  Health Maintenance  There are no preventive care reminders to display for this patient.  Colorectal cancer screening: Type of screening: Cologuard. Completed 11/30/20. Repeat every 3 years  Mammogram status: Completed 11/03/22. Repeat every year  Bone Density status: Completed 05/13/21. Results reflect: Bone density results: NORMAL. Repeat every 5 years.  Lung Cancer Screening: (Low Dose CT Chest recommended if Age 34-80 years, 20 pack-year currently smoking OR have quit w/in 15years.) does qualify.   Lung Cancer Screening Referral: n/a  Additional Screening:  Hepatitis C Screening: does  qualify; Completed 09/20/21  Vision Screening: Recommended annual ophthalmology exams for early detection of glaucoma and other disorders of the eye. Is the patient up to date with their annual eye exam?  Yes  Who is the provider or what is the name of the office in which the patient attends annual eye exams? [facility]  If pt is not established with a provider, would they like to be referred to a provider to establish care? No .   Dental Screening: Recommended annual dental exams for proper oral hygiene  Community Resource Referral / Chronic Care Management: CRR required this visit?  No   CCM required this visit?  No     Plan:     I have personally reviewed and noted the following in the patient's chart:   Medical and social history Use of alcohol, tobacco or illicit drugs  Current medications and supplements including opioid prescriptions. Patient is not currently taking opioid prescriptions. Functional ability and status Nutritional status Physical activity Advanced directives List of other physicians Hospitalizations, surgeries, and ER visits in previous 12 months Vitals Screenings to include cognitive, depression, and falls Referrals and appointments  In addition, I have reviewed and discussed with patient certain preventive protocols, quality metrics, and best practice recommendations. A written personalized care plan for preventive services as well as general preventive health recommendations were provided to patient.     Kandis Fantasia [place], [place]   2/95/6213   After Visit Summary: (MyChart) Due to this being a telephonic visit, the after visit summary with patients personalized plan was offered to patient via MyChart   Nurse Notes: No concerns   I have read this note and agree with its content  Luisa Hart WrightMD

## 2022-12-21 ENCOUNTER — Telehealth: Payer: Self-pay | Admitting: Critical Care Medicine

## 2022-12-21 NOTE — Telephone Encounter (Signed)
Medication Refill - Medication: metFORMIN (GLUCOPHAGE-XR) 500 MG 24 hr tablet  Pt out of this med  Has the patient contacted their pharmacy? yes (Agent: If no, request that the patient contact the pharmacy for the refill. If patient does not wish to contact the pharmacy document the reason why and proceed with request.) (Agent: If yes, when and what did the pharmacy advise?)contact pharmacy  Preferred Pharmacy (with phone number or street name):  [facility] [address] [place]  - [id number] Phone: [phone]  Fax: [phone]     Has the patient been seen for an appointment in the last year OR does the patient have an upcoming appointment? yes  Agent: Please be advised that RX refills may take up to 3 business days. We ask that you follow-up with your pharmacy.

## 2022-12-21 NOTE — Telephone Encounter (Signed)
[facility] called and spoke to [place], Pensions consultant about the refill(s) metformin requested. Advised it was sent on 11/10/22 #90/2 refill(s). She says they have it and it's been ready for pickup for 7 days. I asked about notifying the patient when a Rx is ready. She says there is no number on file and asked if I could give the number to place on file so that patient will receive notifications, number provided. Patient called, left VM to return the call to the office or call [facility] regarding refill requested.

## 2023-03-26 NOTE — Progress Notes (Unsigned)
Established Patient Office Visit  Subjective:  Patient ID: Debbie Barnett, female    DOB: [birth year]  Age: 67 y.o. MRN: [id number]  CC: Primary care follow-up visit:    HPI 04/2021 Debbie Barnett presents for primary care follow-up.  Patient is due a flu vaccine and pneumonia vaccine she agrees to receive both at this visit.  She would also like a referral back to ophthalmology for another eye exam.  Patient's continues to try to lose weight she is down to a BMI of 27.3.  She is interested in joining a fitness center and is can be looking at options that may be covered under her new Medicare advantage insurance plan.  On arrival blood pressure 128/84.  She is fully vaccinated against COVID.  She has no other real complaints at this time.  She does have an upcoming DEXA scan scheduled for January  The patient is due the second zoster and  09/20/21 Patient returns in follow-up.  Since the last office visit she had a bone DEXA scan that was normal.  Blood pressure on arrival is good 128/85.  She maintains Zestoretic and needs refills.  She is trying to eat a healthier diet.  She does need a follow-up lipid panel and health screening lab work.  She has skin tags under the left eye and under the left breast she would like removed.  Patient does have severe dental disease with multiple dental caries and is planning dental visit soon.  Patient had a mammogram in December which showed some bone densities was hard to read but largely was negative for malignancy.  They are recommending repeat study in 1 year not 2 years.  Cologuard study in 2022 was negative repeat is going to be 2025.  Patient does not have any other real complaints at this visit.  She is due a second shingles vaccine and agrees to receive it at our office.  10/10 Patient seen in follow-up she has lost 30 pounds in weight following a lifestyle medicine approach.  She is off all sugary substances and eating a plant-based diet.  Blood  pressure on arrival 115/75.  This is a dramatic improvement.  She is on low-dose lisinopril HCTZ.  Also has hyperlipidemia we will need to follow-up lipid panel.  She has a follow-up mammogram for December of this year.  She has no other primary care gaps and she did agree to receive the flu vaccine this visit  07/19/22 Patient is seen in return follow-up blood pressure on arrival is 200/100 repeat is continues to be elevated.  She has had normal blood pressures in the past.  She notes progressive increased and night sweats which are soaking in nature.  She has never had pheochromocytoma evaluation performed.  Blood pressure in the last few visits has been normal and she has been taking the lisinopril 20 mg daily.  She has prediabetes on metformin and hyperlipidemia on atorvastatin.  11/10/22 Patient seen in return follow-up doing quite well she is lost 40 pounds of weight blood pressure is excellent and there is no complaints today.  She maintains amlodipine lisinopril HCT and carvedilol.  She has noted some lower blood pressure readings at home.  She works in a [employer] and occasionally gets dehydrated takes fluids.  She has prediabetes on metformin.  She has hyperlipidemia on atorvastatin.  03/30/23  Past Medical History:  Diagnosis Date   Blood transfusion without reported diagnosis    ~1996 with gastroenteritis/food poisoning  Chronic hepatitis C (HCC) 01/24/2019   Chronic hepatitis C without hepatic coma (HCC) 01/18/2019   Hypertension     Past Surgical History:  Procedure Laterality Date   ABDOMINAL HYSTERECTOMY      Family History  Problem Relation Age of Onset   Kidney disease Mother    Heart disease Father    Obesity Sister    Heart disease Brother    Hypertension Brother    Alcohol abuse Maternal Grandfather     Social History   Socioeconomic History   Marital status: Single    Spouse name: Not on file   Number of children: Not on file   Years of education:  Not on file   Highest education level: Not on file  Occupational History   Occupation: Fish farm manager  Tobacco Use   Smoking status: Former    Current packs/day: 0.00    Types: Cigarettes    Quit date: 2013    Years since quitting: 11.8   Smokeless tobacco: Never  Substance and Sexual Activity   Alcohol use: Yes    Comment: rare, approximately twice a year   Drug use: Not Currently    Types: Cocaine, Marijuana    Comment: remote IVDU once in late 1970s   Sexual activity: Yes    Birth control/protection: Condom  Other Topics Concern   Not on file  Social History Narrative   Not on file   Social Determinants of Health   Financial Resource Strain: Low Risk  (11/29/2022)   Overall Financial Resource Strain (CARDIA)    Difficulty of Paying Living Expenses: Not hard at all  Food Insecurity: No Food Insecurity (11/29/2022)   Hunger Vital Sign    Worried About Running Out of Food in the Last Year: Never true    Ran Out of Food in the Last Year: Never true  Transportation Needs: No Transportation Needs (11/29/2022)   PRAPARE - Administrator, Civil Service (Medical): No    Lack of Transportation (Non-Medical): No  Physical Activity: Sufficiently Active (11/29/2022)   Exercise Vital Sign    Days of Exercise per Week: 5 days    Minutes of Exercise per Session: 60 min  Stress: No Stress Concern Present (11/29/2022)   [employer] of Occupational Health - Occupational Stress Questionnaire    Feeling of Stress : Not at all  Social Connections: Socially Isolated (11/29/2022)   Social Connection and Isolation Panel [NHANES]    Frequency of Communication with Friends and Family: More than three times a week    Frequency of Social Gatherings with Friends and Family: Three times a week    Attends Religious Services: Never    Active Member of Clubs or Organizations: No    Attends Banker Meetings: Never    Marital Status: Divorced  Catering manager  Violence: Not At Risk (11/29/2022)   Humiliation, Afraid, Rape, and Kick questionnaire    Fear of Current or Ex-Partner: No    Emotionally Abused: No    Physically Abused: No    Sexually Abused: No    Outpatient Medications Prior to Visit  Medication Sig Dispense Refill   amLODipine (NORVASC) 10 MG tablet Take 1 tablet (10 mg total) by mouth daily. 90 tablet 2   atorvastatin (LIPITOR) 10 MG tablet Take 4 times a week 90 tablet 2   lisinopril-hydrochlorothiazide (ZESTORETIC) 20-25 MG tablet Take 1 tablet by mouth daily. 90 tablet 3   metFORMIN (GLUCOPHAGE-XR) 500 MG 24 hr tablet Take 1 tablet (  500 mg total) by mouth daily with breakfast. 90 tablet 2   No facility-administered medications prior to visit.    No Known Allergies  ROS Review of Systems  Constitutional: Negative.   HENT: Negative.  Negative for ear pain, postnasal drip, rhinorrhea, sinus pressure, sore throat, trouble swallowing and voice change.   Eyes: Negative.   Respiratory: Negative.  Negative for apnea, cough, choking, chest tightness, shortness of breath, wheezing and stridor.   Cardiovascular: Negative.  Negative for chest pain, palpitations and leg swelling.  Gastrointestinal: Negative.  Negative for abdominal distention, abdominal pain, nausea and vomiting.  Genitourinary: Negative.   Musculoskeletal: Negative.  Negative for arthralgias and myalgias.  Skin: Negative.  Negative for rash.  Allergic/Immunologic: Negative.  Negative for environmental allergies and food allergies.  Neurological: Negative.  Negative for dizziness, syncope, weakness and headaches.  Hematological: Negative.  Negative for adenopathy. Does not bruise/bleed easily.  Psychiatric/Behavioral: Negative.  Negative for agitation and sleep disturbance. The patient is not nervous/anxious.       Objective:    Physical Exam Vitals reviewed.  Constitutional:      Appearance: Normal appearance. She is well-developed and normal weight. She is  not diaphoretic.  HENT:     Head: Normocephalic and atraumatic.     Nose: No nasal deformity, septal deviation, mucosal edema or rhinorrhea.     Right Sinus: No maxillary sinus tenderness or frontal sinus tenderness.     Left Sinus: No maxillary sinus tenderness or frontal sinus tenderness.     Mouth/Throat:     Pharynx: No oropharyngeal exudate.     Comments: Multiple dental caries seen Eyes:     General: No scleral icterus.    Conjunctiva/sclera: Conjunctivae normal.     Pupils: Pupils are equal, round, and reactive to light.  Neck:     Thyroid: No thyromegaly.     Vascular: No carotid bruit or JVD.     Trachea: Trachea normal. No tracheal tenderness or tracheal deviation.  Cardiovascular:     Rate and Rhythm: Normal rate and regular rhythm.     Chest Wall: PMI is not displaced.     Pulses: Normal pulses. No decreased pulses.     Heart sounds: Normal heart sounds, S1 normal and S2 normal. Heart sounds not distant. No murmur heard.    No systolic murmur is present.     No diastolic murmur is present.     No friction rub. No gallop. No S3 or S4 sounds.  Pulmonary:     Effort: No tachypnea, accessory muscle usage or respiratory distress.     Breath sounds: No stridor. No decreased breath sounds, wheezing, rhonchi or rales.  Chest:     Chest wall: No tenderness.  Abdominal:     General: Bowel sounds are normal. There is no distension.     Palpations: Abdomen is soft. Abdomen is not rigid.     Tenderness: There is no abdominal tenderness. There is no guarding or rebound.  Musculoskeletal:        General: Normal range of motion.     Cervical back: Normal range of motion and neck supple. No edema, erythema or rigidity. No muscular tenderness. Normal range of motion.  Lymphadenopathy:     Head:     Right side of head: No submental or submandibular adenopathy.     Left side of head: No submental or submandibular adenopathy.     Cervical: No cervical adenopathy.  Skin:    General:  Skin is warm and  dry.     Coloration: Skin is not pale.     Findings: No lesion or rash.     Nails: There is no clubbing.  Neurological:     Mental Status: She is alert and oriented to person, place, and time.     Sensory: No sensory deficit.  Psychiatric:        Mood and Affect: Mood normal.        Speech: Speech normal.        Behavior: Behavior normal.        Thought Content: Thought content normal.     There were no vitals taken for this visit. Wt Readings from Last 3 Encounters:  11/29/22 131 lb (59.4 kg)  11/10/22 131 lb (59.4 kg)  08/02/22 130 lb 12.8 oz (59.3 kg)     Health Maintenance Due  Topic Date Due   INFLUENZA VACCINE  12/15/2022      There are no preventive care reminders to display for this patient.  Lab Results  Component Value Date   TSH 2.430 01/17/2019   Lab Results  Component Value Date   WBC 5.6 02/22/2022   HGB 12.1 02/22/2022   HCT 36.8 02/22/2022   MCV 89 02/22/2022   PLT 203 02/22/2022   Lab Results  Component Value Date   NA 138 11/10/2022   K 4.7 11/10/2022   CO2 23 11/10/2022   GLUCOSE 103 (H) 11/10/2022   BUN 30 (H) 11/10/2022   CREATININE 0.96 11/10/2022   BILITOT 0.3 11/10/2022   ALKPHOS 57 11/10/2022   AST 25 11/10/2022   ALT 25 11/10/2022   PROT 7.0 11/10/2022   ALBUMIN 4.7 11/10/2022   CALCIUM 9.9 11/10/2022   ANIONGAP 10 12/21/2018   EGFR 65 11/10/2022   Lab Results  Component Value Date   CHOL 204 (H) 11/10/2022   Lab Results  Component Value Date   HDL 84 11/10/2022   Lab Results  Component Value Date   LDLCALC 100 (H) 11/10/2022   Lab Results  Component Value Date   TRIG 116 11/10/2022   Lab Results  Component Value Date   CHOLHDL 2.4 11/10/2022  FINDINGS: AP LUMBAR SPINE (L1-L4)   Bone Mineral Density (BMD):  1.079 g/cm2   Young Adult T-Score:  0.3   Z-Score:  2.1   LEFT FEMUR NECK   Bone Mineral Density (BMD):  0.784 g/cm2   Young Adult T-Score: -0.6   Z-Score:  1.0   Unit: This  study was performed at [facility] on the [employer] (S/N [phone]), software version 13.4.2.   Scan quality: The scan quality is good. Exclusions: None.   ASSESSMENT: Patient's diagnostic category is NORMAL by WHO Criteria.   FRACTURE RISK: NOT INCREASED.   FRAX: World Health Organization FRAX assessment of absolute fracture risk is not calculated for this patient because the patient has normal bone mineral density with all T-scores at or above -1.0.   COMPARISON: None.   RECOMMENDATIONS   1. All patients should optimize calcium and vitamin D intake.   2. Consider FDA-approved medical therapies in postmenopausal women and men aged 45 years and older, based on the following:   - A hip or vertebral (clinical or morphometric) fracture   - T-score less than or equal to -2.5 at the femoral neck or spine after appropriate evaluation to exclude secondary causes   - Low bone mass (T-score between -1.0 and -2.5 at the femoral neck or spine) and a 10-year probability of a hip  fracture greater than or equal to 3% or a 10-year probability of a major osteoporosis-related fracture greater than or equal to 20% based on the [place]-adapted WHO algorithm   - Clinician judgment and/or patient preferences may indicate treatment for people with 10-year fracture probabilities above or below these levels   3. Patients with diagnosis of osteoporosis or at high risk for fracture should have regular bone mineral density tests. For patients eligible for Medicare, routine testing is allowed once every 2 years. The testing frequency can be increased to one year for patients who have rapidly progressing disease, those who are receiving or discontinuing medical therapy to restore bone mass, or have additional risk factors.     Electronically Signed   By: Hulan Saas M.D.   On: 05/14/2021 07:40   Lab Results  Component Value Date   HGBA1C 6.1 11/10/2022      Assessment & Plan:    Problem List Items Addressed This Visit   None   No orders of the defined types were placed in this encounter.   Follow-up: No follow-ups on file.    Debbie Levans, MD

## 2023-03-28 ENCOUNTER — Ambulatory Visit: Payer: Medicare Other | Admitting: Critical Care Medicine

## 2023-03-30 ENCOUNTER — Encounter: Payer: Self-pay | Admitting: Critical Care Medicine

## 2023-03-30 ENCOUNTER — Ambulatory Visit: Payer: Medicare Other | Attending: Critical Care Medicine | Admitting: Critical Care Medicine

## 2023-03-30 ENCOUNTER — Other Ambulatory Visit (HOSPITAL_COMMUNITY)
Admission: RE | Admit: 2023-03-30 | Discharge: 2023-03-30 | Disposition: A | Discharge status: Home / Self Care | Payer: Medicare Other | Source: Ambulatory Visit | Attending: Critical Care Medicine | Admitting: Critical Care Medicine

## 2023-03-30 VITALS — BP 130/65 | HR 60 | Wt 128.2 lb

## 2023-03-30 DIAGNOSIS — I10 Essential (primary) hypertension: Secondary | ICD-10-CM | POA: Diagnosis not present

## 2023-03-30 DIAGNOSIS — E782 Mixed hyperlipidemia: Secondary | ICD-10-CM | POA: Diagnosis not present

## 2023-03-30 DIAGNOSIS — R61 Generalized hyperhidrosis: Secondary | ICD-10-CM | POA: Diagnosis not present

## 2023-03-30 DIAGNOSIS — N898 Other specified noninflammatory disorders of vagina: Secondary | ICD-10-CM | POA: Diagnosis present

## 2023-03-30 DIAGNOSIS — R7303 Prediabetes: Secondary | ICD-10-CM | POA: Diagnosis not present

## 2023-03-30 NOTE — Assessment & Plan Note (Signed)
Continue statin. 

## 2023-03-30 NOTE — Assessment & Plan Note (Signed)
Obtain cervical vaginal swab and also urinalysis

## 2023-03-30 NOTE — Patient Instructions (Signed)
Complete lab screening will be obtained today to evaluate for night sweats  I suggest we do get a chest x-ray and also check for tuberculosis and HIV while I do not think you are high risk for the tuberculosis or HIV it needs to be checked  There are other labs are going to check to look for your immune status if they are abnormal we might consider a CT scan of your chest and abdomen however we will wait on this till we get the chest x-ray and other lab work  Cervical vaginal swab was obtained No change in medications you have long-term refills on all medications available  Go to your local pharmacy for a flu shot in about 2 weeks

## 2023-03-30 NOTE — Assessment & Plan Note (Signed)
Well-controlled on amlodipine and lisinopril HCT no changes made

## 2023-03-30 NOTE — Assessment & Plan Note (Signed)
Chronic recurring drenching night sweats evaluation for this will include FSH, C-reactive protein, HIV study, CMV, chest x-ray, thyroid panel CBC and metabolic panel

## 2023-03-30 NOTE — Assessment & Plan Note (Signed)
Continue metformin.

## 2023-04-02 ENCOUNTER — Encounter: Payer: Self-pay | Admitting: Critical Care Medicine

## 2023-04-02 DIAGNOSIS — R768 Other specified abnormal immunological findings in serum: Secondary | ICD-10-CM | POA: Insufficient documentation

## 2023-04-02 NOTE — Progress Notes (Signed)
Patient aware of results.

## 2023-04-03 ENCOUNTER — Ambulatory Visit
Admission: RE | Admit: 2023-04-03 | Discharge: 2023-04-03 | Disposition: A | Discharge status: Home / Self Care | Payer: Medicare Other | Source: Ambulatory Visit | Attending: Critical Care Medicine | Admitting: Critical Care Medicine

## 2023-04-03 DIAGNOSIS — R059 Cough, unspecified: Secondary | ICD-10-CM | POA: Diagnosis not present

## 2023-04-03 DIAGNOSIS — R61 Generalized hyperhidrosis: Secondary | ICD-10-CM

## 2023-04-03 LAB — CBC WITH DIFFERENTIAL/PLATELET
Basophils Absolute: 0.1 10*3/uL (ref 0.0–0.2)
Basos: 1 %
EOS (ABSOLUTE): 0.2 10*3/uL (ref 0.0–0.4)
Eos: 2 %
Hematocrit: 38.1 % (ref 34.0–46.6)
Hemoglobin: 12.6 g/dL (ref 11.1–15.9)
Immature Grans (Abs): 0 10*3/uL (ref 0.0–0.1)
Immature Granulocytes: 0 %
Lymphocytes Absolute: 2.1 10*3/uL (ref 0.7–3.1)
Lymphs: 34 %
MCH: 30.4 pg (ref 26.6–33.0)
MCHC: 33.1 g/dL (ref 31.5–35.7)
MCV: 92 fL (ref 79–97)
Monocytes Absolute: 0.7 10*3/uL (ref 0.1–0.9)
Monocytes: 11 %
Neutrophils Absolute: 3.2 10*3/uL (ref 1.4–7.0)
Neutrophils: 52 %
Platelets: 240 10*3/uL (ref 150–450)
RBC: 4.15 x10E6/uL (ref 3.77–5.28)
RDW: 12.8 % (ref 11.7–15.4)
WBC: 6.2 10*3/uL (ref 3.4–10.8)

## 2023-04-03 LAB — URINALYSIS
Bilirubin, UA: NEGATIVE
Glucose, UA: NEGATIVE
Ketones, UA: NEGATIVE
Leukocytes,UA: NEGATIVE
Nitrite, UA: NEGATIVE
Protein,UA: NEGATIVE
RBC, UA: NEGATIVE
Specific Gravity, UA: 1.017 (ref 1.005–1.030)
Urobilinogen, Ur: 0.2 mg/dL (ref 0.2–1.0)
pH, UA: 7.5 (ref 5.0–7.5)

## 2023-04-03 LAB — QUANTIFERON-TB GOLD PLUS
QuantiFERON Mitogen Value: 3.49 [IU]/mL
QuantiFERON Nil Value: 0 [IU]/mL
QuantiFERON TB1 Ag Value: 0 [IU]/mL
QuantiFERON TB2 Ag Value: 0 [IU]/mL
QuantiFERON-TB Gold Plus: NEGATIVE

## 2023-04-03 LAB — COMPREHENSIVE METABOLIC PANEL
ALT: 38 IU/L — ABNORMAL HIGH (ref 0–32)
AST: 33 [IU]/L (ref 0–40)
Albumin: 4.9 g/dL (ref 3.9–4.9)
Alkaline Phosphatase: 89 [IU]/L (ref 44–121)
BUN/Creatinine Ratio: 19 (ref 12–28)
BUN: 17 mg/dL (ref 8–27)
Bilirubin Total: 0.3 mg/dL (ref 0.0–1.2)
CO2: 21 mmol/L (ref 20–29)
Calcium: 9.7 mg/dL (ref 8.7–10.3)
Chloride: 99 mmol/L (ref 96–106)
Creatinine, Ser: 0.9 mg/dL (ref 0.57–1.00)
Globulin, Total: 2.5 g/dL (ref 1.5–4.5)
Glucose: 92 mg/dL (ref 70–99)
Potassium: 4.4 mmol/L (ref 3.5–5.2)
Sodium: 142 mmol/L (ref 134–144)
Total Protein: 7.4 g/dL (ref 6.0–8.5)
eGFR: 70 mL/min/{1.73_m2} (ref >59)

## 2023-04-03 LAB — CERVICOVAGINAL ANCILLARY ONLY
Bacterial Vaginitis (gardnerella): NEGATIVE
Candida Glabrata: NEGATIVE
Candida Vaginitis: NEGATIVE
Chlamydia: NEGATIVE
Comment: NEGATIVE
Comment: NEGATIVE
Comment: NEGATIVE
Comment: NEGATIVE
Comment: NEGATIVE
Comment: NORMAL
Neisseria Gonorrhea: NEGATIVE
Trichomonas: NEGATIVE

## 2023-04-03 LAB — CMV ABS, IGG+IGM (CYTOMEGALOVIRUS)
CMV Ab - IgG: >10 U/mL — ABNORMAL HIGH (ref 0.00–0.59)
CMV IgM Ser EIA-aCnc: 35.1 [AU]/ml — ABNORMAL HIGH (ref 0.0–29.9)

## 2023-04-03 LAB — THYROID PANEL WITH TSH
Free Thyroxine Index: 2.7 (ref 1.2–4.9)
T3 Uptake Ratio: 26 % (ref 24–39)
T4, Total: 10.4 ug/dL (ref 4.5–12.0)
TSH: 4.69 u[IU]/mL — ABNORMAL HIGH (ref 0.450–4.500)

## 2023-04-03 LAB — C-REACTIVE PROTEIN: CRP: <1 mg/L (ref 0–10)

## 2023-04-03 LAB — HIV ANTIBODY (ROUTINE TESTING W REFLEX): HIV Screen 4th Generation wRfx: NONREACTIVE

## 2023-04-03 LAB — FOLLICLE STIMULATING HORMONE: FSH: 113 m[IU]/mL (ref 25.8–134.8)

## 2023-04-10 ENCOUNTER — Other Ambulatory Visit: Payer: Self-pay | Admitting: Critical Care Medicine

## 2023-04-10 DIAGNOSIS — R61 Generalized hyperhidrosis: Secondary | ICD-10-CM

## 2023-04-10 DIAGNOSIS — R9389 Abnormal findings on diagnostic imaging of other specified body structures: Secondary | ICD-10-CM

## 2023-04-10 NOTE — Progress Notes (Signed)
Pt aware of results still symptomatic may need FOB / pulmonary consult and then ID Get CT chest without contrast asap

## 2023-04-11 ENCOUNTER — Telehealth: Payer: Self-pay | Admitting: Critical Care Medicine

## 2023-04-11 NOTE — Telephone Encounter (Signed)
Copied from CRM [id number]. Topic: General - Inquiry >> Apr 11, 2023  8:53 AM Lennox Pippins wrote: Patient called and wanted to get a message over to Dr Delford Field for when he is back in the office.  Patient states she did schedule her Imaging appt for Monday, 04/17/2023 at 3:20p at the imaging place on [facility]. Patient states that Dr Delford Field wanted her to have this as soon a possible and wanted him to have this update.  Patient's callback # [phone]

## 2023-04-12 ENCOUNTER — Other Ambulatory Visit: Payer: Self-pay | Admitting: Critical Care Medicine

## 2023-04-17 ENCOUNTER — Ambulatory Visit
Admission: RE | Admit: 2023-04-17 | Discharge: 2023-04-17 | Disposition: A | Discharge status: Home / Self Care | Payer: Medicare Other | Source: Ambulatory Visit | Attending: Critical Care Medicine | Admitting: Critical Care Medicine

## 2023-04-17 DIAGNOSIS — R61 Generalized hyperhidrosis: Secondary | ICD-10-CM

## 2023-04-17 DIAGNOSIS — R9389 Abnormal findings on diagnostic imaging of other specified body structures: Secondary | ICD-10-CM

## 2023-04-17 DIAGNOSIS — J439 Emphysema, unspecified: Secondary | ICD-10-CM | POA: Diagnosis not present

## 2023-04-17 DIAGNOSIS — I700 Atherosclerosis of aorta: Secondary | ICD-10-CM | POA: Diagnosis not present

## 2023-04-17 DIAGNOSIS — R911 Solitary pulmonary nodule: Secondary | ICD-10-CM | POA: Diagnosis not present

## 2023-04-19 ENCOUNTER — Encounter: Payer: Self-pay | Admitting: Critical Care Medicine

## 2023-04-21 ENCOUNTER — Telehealth: Payer: Self-pay

## 2023-04-21 ENCOUNTER — Telehealth: Payer: Self-pay | Admitting: Critical Care Medicine

## 2023-04-21 DIAGNOSIS — R9389 Abnormal findings on diagnostic imaging of other specified body structures: Secondary | ICD-10-CM | POA: Insufficient documentation

## 2023-04-21 DIAGNOSIS — J432 Centrilobular emphysema: Secondary | ICD-10-CM | POA: Insufficient documentation

## 2023-04-21 NOTE — Telephone Encounter (Signed)
-----   Message from Shan Levans sent at 04/21/2023  3:41 PM EST ----- [name]. I called the patient you do not need to any othrt follow up

## 2023-04-21 NOTE — Telephone Encounter (Signed)
Called patient to help with mychart, she stated that she would call me back to fix (pt was at work)

## 2023-04-21 NOTE — Telephone Encounter (Signed)
Noted  

## 2023-04-21 NOTE — Telephone Encounter (Signed)
Discussed with patient CT of the chest shows mild centrilobular emphysema apically spiculated scar right upper lobe this needs to be reassessed annually otherwise no causes for night sweats can be seen in the night sweats have resolved actually at this time we will continue observation

## 2023-04-21 NOTE — Progress Notes (Signed)
Carly. I called the patient you do not need to any othrt follow up

## 2023-05-02 ENCOUNTER — Ambulatory Visit: Payer: Self-pay

## 2023-05-02 ENCOUNTER — Encounter: Payer: Self-pay | Admitting: Physician Assistant

## 2023-05-02 ENCOUNTER — Ambulatory Visit: Payer: Medicare Other | Admitting: Physician Assistant

## 2023-05-02 VITALS — BP 134/75 | HR 69 | Ht 66.0 in | Wt 128.0 lb

## 2023-05-02 DIAGNOSIS — J432 Centrilobular emphysema: Secondary | ICD-10-CM

## 2023-05-02 DIAGNOSIS — J209 Acute bronchitis, unspecified: Secondary | ICD-10-CM | POA: Diagnosis not present

## 2023-05-02 MED ORDER — CETIRIZINE HCL 10 MG PO TABS
10.0000 mg | ORAL_TABLET | Freq: Every day | ORAL | 11 refills | Status: AC
Start: 2023-05-02 — End: ?

## 2023-05-02 MED ORDER — BENZONATATE 100 MG PO CAPS
ORAL_CAPSULE | ORAL | 0 refills | Status: DC
Start: 2023-05-02 — End: 2023-08-29

## 2023-05-02 NOTE — Telephone Encounter (Signed)
Message from [place] P sent at 05/02/2023 11:47 AM EST  Summary: cough and congestion   Pt called saying she sen Dr. Delford Field in Nov for a cough.  She says she still has a cough and lots of congestion.  She said she was having night sweats too but they have stopped.  She wants to know if someone will call something in for her.  She would like something asap.  She uses [facility] on [address].  CB@  [phone]         Chief Complaint: continued cough and mucous Symptoms: cough moderate amount of throat and nose secretions Frequency: since 03/20/23 Pertinent Negatives: Patient denies SOB, fever Disposition: [] ED /[] Urgent Care (no appt availability in office) / [] Appointment(In office/virtual)/ []  [facility]/ [] Home Care/ [] Refused Recommended Disposition /[x] [place] [facility]/ []  Follow-up with PCP Additional Notes: pt would like Dr Delford Field updated   Reason for Disposition  Cough has been present for > 3 weeks  Answer Assessment - Initial Assessment Questions 1. ONSET: "When did the cough begin?"      November 7th  2. SEVERITY: "How bad is the cough today?"      Frequent cough 3. SPUTUM: "Describe the color of your sputum" (none, dry cough; clear, white, yellow, green)     Clear gets chalky and thicker 4. HEMOPTYSIS: "Are you coughing up any blood?" If so ask: "How much?" (flecks, streaks, tablespoons, etc.)     *No Answer* 5. DIFFICULTY BREATHING: "Are you having difficulty breathing?" If Yes, ask: "How bad is it?" (e.g., mild, moderate, severe)    - MILD: No SOB at rest, mild SOB with walking, speaks normally in sentences, can lie down, no retractions, pulse < 100.    - MODERATE: SOB at rest, SOB with minimal exertion and prefers to sit, cannot lie down flat, speaks in phrases, mild retractions, audible wheezing, pulse 100-120.    - SEVERE: Very SOB at rest, speaks in single words, struggling to breathe, sitting hunched forward, retractions, pulse > 120      normal 6.  FEVER: "Do you have a fever?" If Yes, ask: "What is your temperature, how was it measured, and when did it start?"     no 10. OTHER SYMPTOMS: "Do you have any other symptoms?" (e.g., runny nose, wheezing, chest pain)       Runny nose,  large amount of mucus from throat and from nose  Protocols used: Cough - Acute Productive-A-AH

## 2023-05-02 NOTE — Progress Notes (Unsigned)
Established Patient Office Visit  Subjective   Patient ID: Debbie Barnett, female    DOB: Jul 03, 1955  Age: 67 y.o. MRN: [id number]  Chief Complaint  Patient presents with   Cough    Prolonged cough since October. Patient states she has mucus     States that she continues to have a very bothersome dry cough since the beginning of November.  States that she also has been having a large amount of nasal congestion, states that it starts out clear in the morning and becomes thicker and chalky like throughout the day.  States that she was evaluated by her primary care provider, did have chest CT that showed mild centrilobular emphysema apically spiculated scar right upper lobe   Does endorse smoking history, quit smoking in 2014.  Denies shortness of breath, cough is not keeping her awake at night.  States that she use an over-the-counter cough medication, but does endorse that it made her blood pressure go up so she discontinued it.    Past Medical History:  Diagnosis Date   Blood transfusion without reported diagnosis    ~1996 with gastroenteritis/food poisoning   Chronic hepatitis C (HCC) 01/24/2019   Chronic hepatitis C without hepatic coma (HCC) 01/18/2019   Hypertension    Social History   Socioeconomic History   Marital status: Single    Spouse name: Not on file   Number of children: Not on file   Years of education: Not on file   Highest education level: Not on file  Occupational History   Occupation: Fish farm manager  Tobacco Use   Smoking status: Former    Current packs/day: 0.00    Types: Cigarettes    Quit date: 2013    Years since quitting: 11.9   Smokeless tobacco: Never  Substance and Sexual Activity   Alcohol use: Yes    Comment: rare, approximately twice a year   Drug use: Not Currently    Types: Cocaine, Marijuana    Comment: remote IVDU once in late 1970s   Sexual activity: Yes    Birth control/protection: Condom  Other Topics Concern   Not  on file  Social History Narrative   Not on file   Social Drivers of Health   Financial Resource Strain: Low Risk  (11/29/2022)   Overall Financial Resource Strain (CARDIA)    Difficulty of Paying Living Expenses: Not hard at all  Food Insecurity: No Food Insecurity (11/29/2022)   Hunger Vital Sign    Worried About Running Out of Food in the Last Year: Never true    Ran Out of Food in the Last Year: Never true  Transportation Needs: No Transportation Needs (11/29/2022)   PRAPARE - Administrator, Civil Service (Medical): No    Lack of Transportation (Non-Medical): No  Physical Activity: Sufficiently Active (11/29/2022)   Exercise Vital Sign    Days of Exercise per Week: 5 days    Minutes of Exercise per Session: 60 min  Stress: No Stress Concern Present (11/29/2022)   [employer] of Occupational Health - Occupational Stress Questionnaire    Feeling of Stress : Not at all  Social Connections: Socially Isolated (11/29/2022)   Social Connection and Isolation Panel [NHANES]    Frequency of Communication with Friends and Family: More than three times a week    Frequency of Social Gatherings with Friends and Family: Three times a week    Attends Religious Services: Never    Active Member of Clubs or  Organizations: No    Attends Banker Meetings: Never    Marital Status: Divorced  Catering manager Violence: Not At Risk (11/29/2022)   Humiliation, Afraid, Rape, and Kick questionnaire    Fear of Current or Ex-Partner: No    Emotionally Abused: No    Physically Abused: No    Sexually Abused: No   Family History  Problem Relation Age of Onset   Kidney disease Mother    Heart disease Father    Obesity Sister    Heart disease Brother    Hypertension Brother    Alcohol abuse Maternal Grandfather    No Known Allergies  Review of Systems  Constitutional:  Negative for chills and fever.  HENT:  Positive for congestion. Negative for sinus pain and sore throat.    Eyes: Negative.   Respiratory:  Positive for cough. Negative for sputum production, shortness of breath and wheezing.   Cardiovascular:  Negative for chest pain.  Gastrointestinal:  Negative for abdominal pain, nausea and vomiting.  Genitourinary: Negative.   Musculoskeletal:  Negative for myalgias.  Skin: Negative.   Neurological: Negative.   Endo/Heme/Allergies: Negative.   Psychiatric/Behavioral: Negative.        Objective:     BP 134/75 (BP Location: Left Arm, Patient Position: Sitting, Cuff Size: Normal)   Pulse 69   Ht 5\' 6"  (1.676 m)   Wt 128 lb (58.1 kg)   BMI 20.66 kg/m  BP Readings from Last 3 Encounters:  05/02/23 134/75  03/30/23 130/65  11/10/22 122/69   Wt Readings from Last 3 Encounters:  05/02/23 128 lb (58.1 kg)  03/30/23 128 lb 3.2 oz (58.2 kg)  11/29/22 131 lb (59.4 kg)    Physical Exam Vitals and nursing note reviewed.  Constitutional:      Appearance: Normal appearance.  HENT:     Head: Normocephalic and atraumatic.     Right Ear: External ear normal.     Left Ear: External ear normal.     Nose: Nose normal.     Mouth/Throat:     Mouth: Mucous membranes are moist.     Pharynx: Oropharynx is clear.  Eyes:     Extraocular Movements: Extraocular movements intact.     Conjunctiva/sclera: Conjunctivae normal.     Pupils: Pupils are equal, round, and reactive to light.  Cardiovascular:     Rate and Rhythm: Normal rate and regular rhythm.     Pulses: Normal pulses.     Heart sounds: Normal heart sounds.  Pulmonary:     Effort: Pulmonary effort is normal.     Breath sounds: Normal breath sounds. No wheezing.  Musculoskeletal:        General: Normal range of motion.     Cervical back: Normal range of motion and neck supple.  Skin:    General: Skin is warm and dry.  Neurological:     General: No focal deficit present.     Mental Status: She is alert and oriented to person, place, and time.  Psychiatric:        Mood and Affect: Mood normal.         Behavior: Behavior normal.        Thought Content: Thought content normal.        Judgment: Judgment normal.        Assessment & Plan:   Problem List Items Addressed This Visit       Respiratory   Centrilobular emphysema (HCC)   Relevant Medications   cetirizine (ZYRTEC  ALLERGY) 10 MG tablet   benzonatate (TESSALON) 100 MG capsule   Other Visit Diagnoses       Acute bronchitis, unspecified organism    -  Primary   Relevant Medications   cetirizine (ZYRTEC ALLERGY) 10 MG tablet   benzonatate (TESSALON) 100 MG capsule   Other Relevant Orders   Ambulatory referral to Pulmonology      1. Acute bronchitis, unspecified organism (Primary) Trial Zyrtec, Tessalon Perles.  Patient education given on supportive care, red flags given for prompt reevaluation. - cetirizine (ZYRTEC ALLERGY) 10 MG tablet; Take 1 tablet (10 mg total) by mouth daily.  Dispense: 30 tablet; Refill: 11 - benzonatate (TESSALON) 100 MG capsule; Take 1-2 caps PO TID PRN  Dispense: 20 capsule; Refill: 0   2. Centrilobular emphysema (HCC) Patient would like to be seen by pulmonology for further evaluation. - Ambulatory referral to Pulmonology   I have reviewed the patient's medical history (PMH, PSH, Social History, Family History, Medications, and allergies) , and have been updated if relevant. I spent 30 minutes reviewing chart and  face to face time with patient.    Return if symptoms worsen or fail to improve.    Kasandra Knudsen Mayers, PA-C

## 2023-05-02 NOTE — Telephone Encounter (Signed)
Medication prescribed by Mrs. Mayers, with [facility].  Benzonatate and Certirizine.

## 2023-05-02 NOTE — Patient Instructions (Addendum)
I encourage you to start taking Zyrtec on a daily basis.  I have sent a prescription for cough medication to your pharmacy.     I have started a referral for you to be seen by pulmonology for further evaluation.  They will reach out to you to schedule an appointment.  Please let us know if there is anything else we can do for you. Roney Jaffe, PA-C Physician Assistant [facility] [email or web address]   Acute Bronchitis, Adult  Acute bronchitis is sudden inflammation of the main airways (bronchi) that come off the windpipe (trachea) in the lungs. The swelling causes the airways to get smaller and make more mucus than normal. This can make it hard to breathe and can cause coughing or noisy breathing (wheezing). Acute bronchitis may last several weeks. The cough may last longer. Allergies, asthma, and exposure to smoke may make the condition worse. What are the causes? This condition can be caused by germs and by substances that irritate the lungs, including: Cold and flu viruses. The most common cause of this condition is the virus that causes the common cold. Bacteria. This is less common. Breathing in substances that irritate the lungs, including: Smoke from cigarettes and other forms of tobacco. Dust and pollen. Fumes from household cleaning products, gases, or burned fuel. Indoor or outdoor air pollution. What increases the risk? The following factors may make you more likely to develop this condition: A weak body's defense system, also called the immune system. A condition that affects your lungs and breathing, such as asthma. What are the signs or symptoms? Common symptoms of this condition include: Coughing. This may bring up clear, yellow, or green mucus from your lungs (sputum). Wheezing. Runny or stuffy nose. Having too much mucus in your lungs (chest congestion). Shortness of breath. Aches and pains, including  sore throat or chest. How is this diagnosed? This condition is usually diagnosed based on: Your symptoms and medical history. A physical exam. You may also have other tests, including tests to rule out other conditions, such as pneumonia. These tests include: A test of lung function. Test of a mucus sample to look for the presence of bacteria. Tests to check the oxygen level in your blood. Blood tests. Chest X-ray. How is this treated? Most cases of acute bronchitis clear up over time without treatment. Your health care provider may recommend: Drinking more fluids to help thin your mucus so it is easier to cough up. Taking inhaled medicine (inhaler) to improve air flow in and out of your lungs. Using a vaporizer or a humidifier. These are machines that add water to the air to help you breathe better. Taking a medicine that thins mucus and clears congestion (expectorant). Taking a medicine that prevents or stops coughing (cough suppressant). It is not common to take an antibiotic medicine for this condition. Follow these instructions at home:  Take over-the-counter and prescription medicines only as told by your health care provider. Use an inhaler, vaporizer, or humidifier as told by your health care provider. Take two teaspoons (10 mL) of honey at bedtime to lessen coughing at night. Drink enough fluid to keep your urine pale yellow. Do not use any products that contain nicotine or tobacco. These products include cigarettes, chewing tobacco, and vaping devices, such as e-cigarettes. If you need help quitting, ask your health care provider. Get plenty of rest. Return to your normal activities as told by your health care provider. Ask your health care provider what activities  are safe for you. Keep all follow-up visits. This is important. How is this prevented? To lower your risk of getting this condition again: Wash your hands often with soap and water for at least 20 seconds. If soap  and water are not available, use hand sanitizer. Avoid contact with people who have cold symptoms. Try not to touch your mouth, nose, or eyes with your hands. Avoid breathing in smoke or chemical fumes. Breathing smoke or chemical fumes will make your condition worse. Get the flu shot every year. Contact a health care provider if: Your symptoms do not improve after 2 weeks. You have trouble coughing up the mucus. Your cough keeps you awake at night. You have a fever. Get help right away if you: Cough up blood. Feel pain in your chest. Have severe shortness of breath. Faint or keep feeling like you are going to faint. Have a severe headache. Have a fever or chills that get worse. These symptoms may represent a serious problem that is an emergency. Do not wait to see if the symptoms will go away. Get medical help right away. Call your local emergency services (911 in the [place] Do not drive yourself to the hospital. Summary Acute bronchitis is inflammation of the main airways (bronchi) that come off the windpipe (trachea) in the lungs. The swelling causes the airways to get smaller and make more mucus than normal. Drinking more fluids can help thin your mucus so it is easier to cough up. Take over-the-counter and prescription medicines only as told by your health care provider. Do not use any products that contain nicotine or tobacco. These products include cigarettes, chewing tobacco, and vaping devices, such as e-cigarettes. If you need help quitting, ask your health care provider. Contact a health care provider if your symptoms do not improve after 2 weeks. This information is not intended to replace advice given to you by your health care provider. Make sure you discuss any questions you have with your health care provider. Document Revised: 08/12/2021 Document Reviewed: 09/02/2020 Elsevier Patient Education  2024 [employer].

## 2023-05-03 ENCOUNTER — Encounter: Payer: Self-pay | Admitting: Physician Assistant

## 2023-05-28 IMAGING — MG MM DIGITAL SCREENING BILAT W/ TOMO AND CAD
8 series · 8 of 24 positions shown · non-contrast
Comparison: Previous exam(s).

CLINICAL DATA: Screening.

EXAM:
DIGITAL SCREENING BILATERAL MAMMOGRAM WITH TOMOSYNTHESIS AND CAD
TECHNIQUE: Bilateral screening digital craniocaudal and mediolateral oblique
mammograms were obtained. Bilateral screening digital breast
tomosynthesis was performed. The images were evaluated with
computer-aided detection.

[R MLO synth-2D]
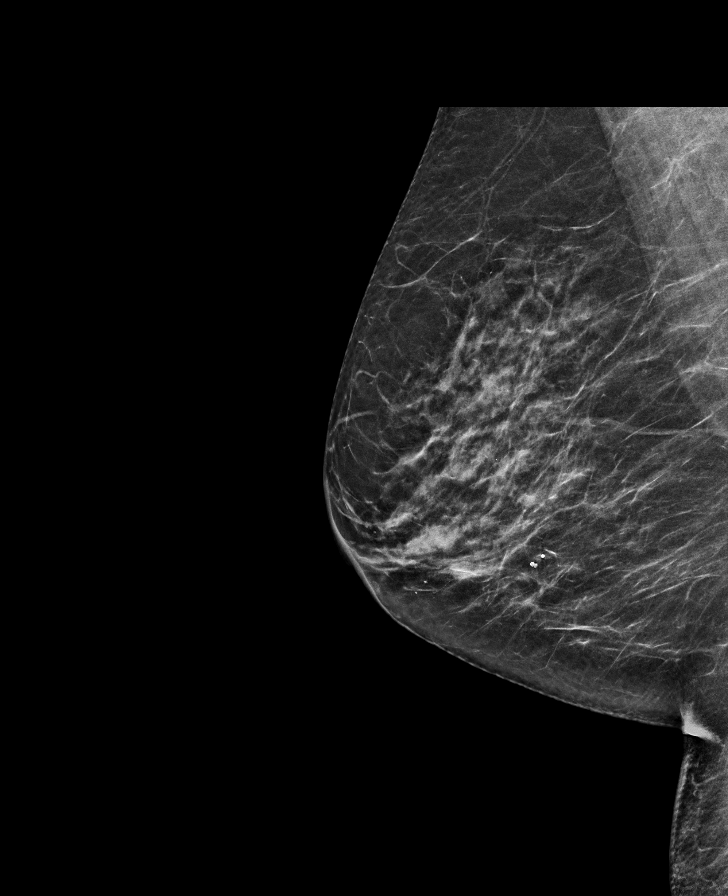

[L MLO synth-2D]
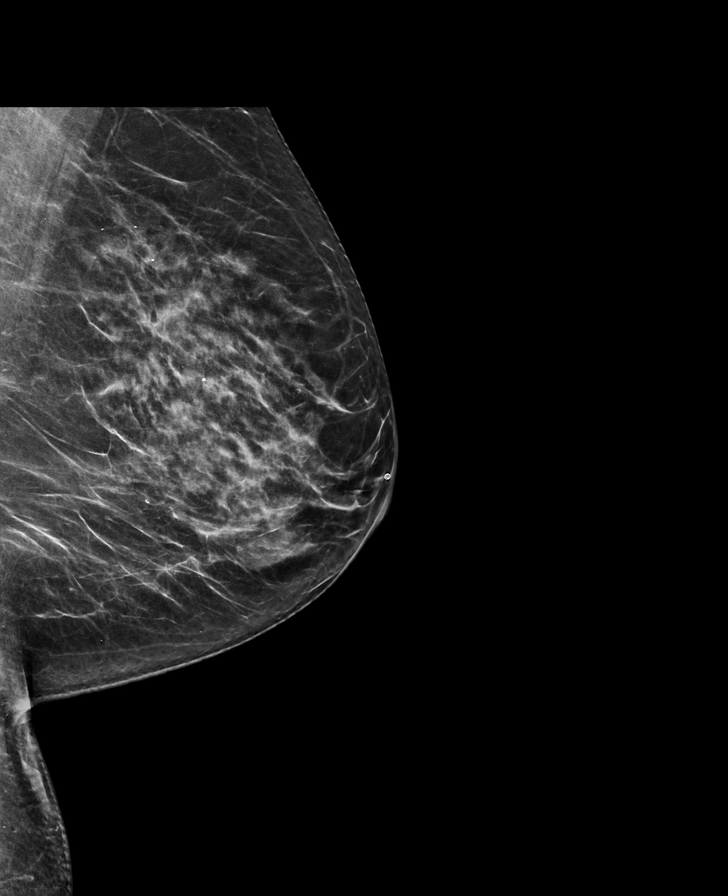

[R CC synth-2D]
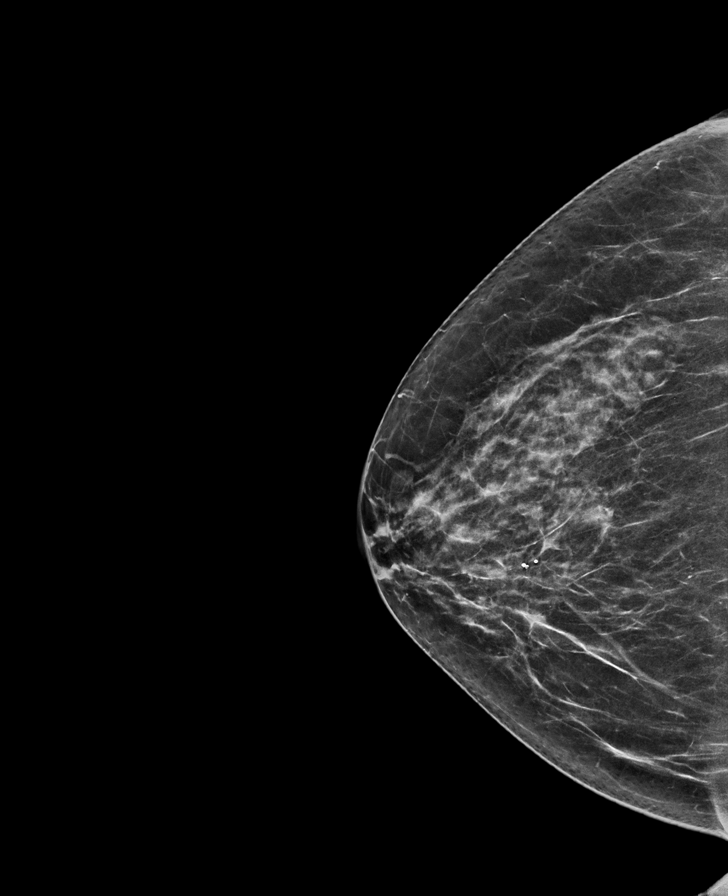

[L CC synth-2D]
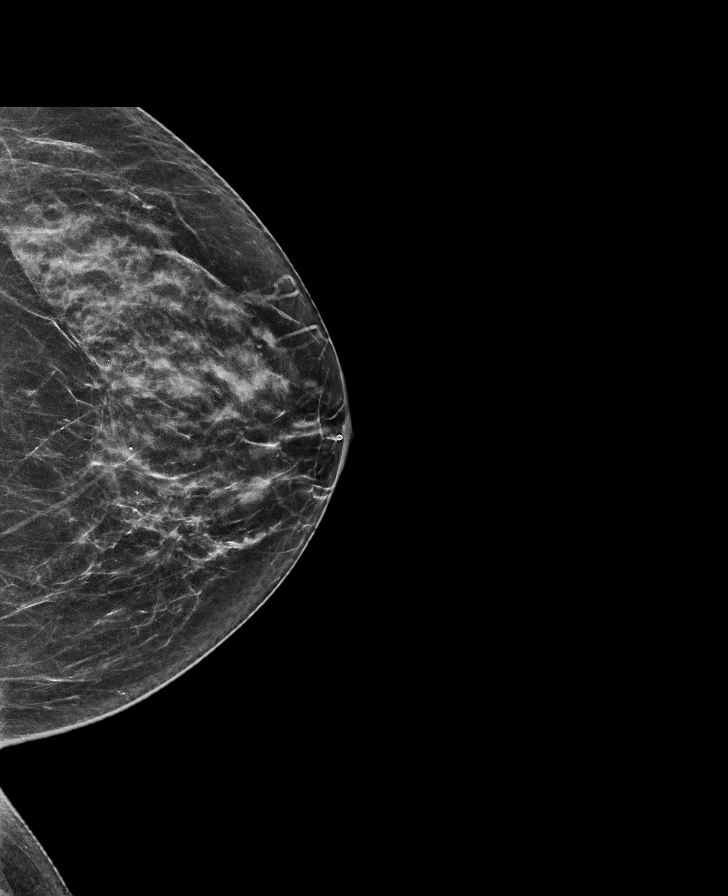

[L MLO tomo · tomo slice 31/62.0]
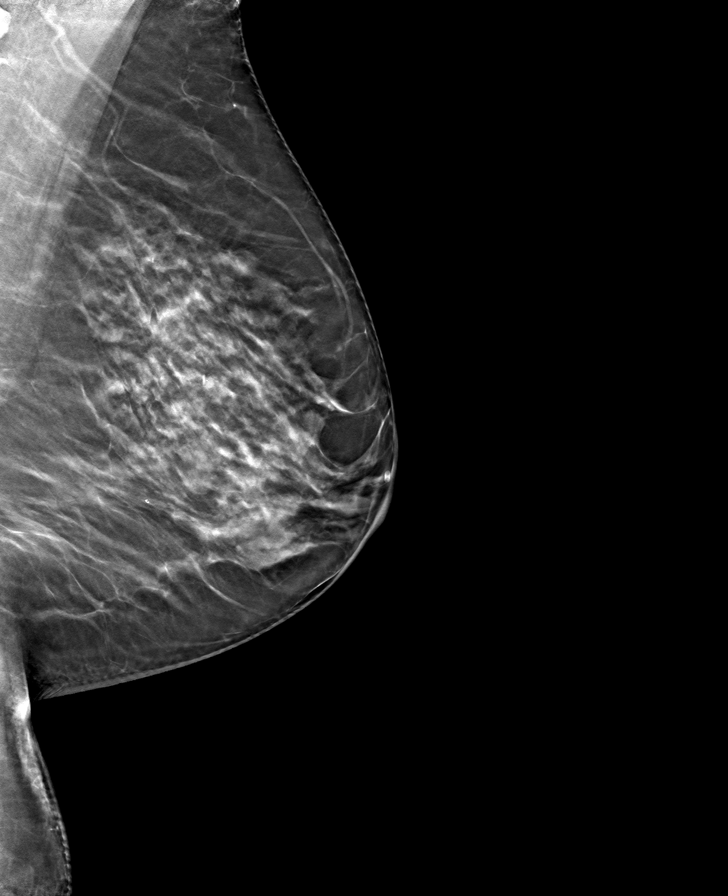

[R CC tomo · tomo slice 31/60.0]
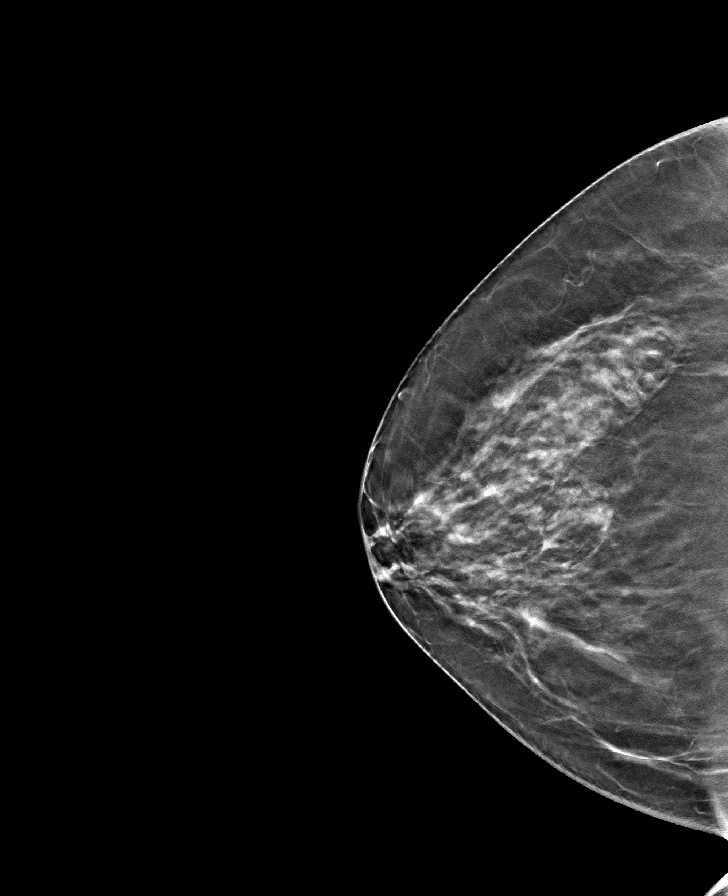

[R MLO tomo · tomo slice 30/59.0]
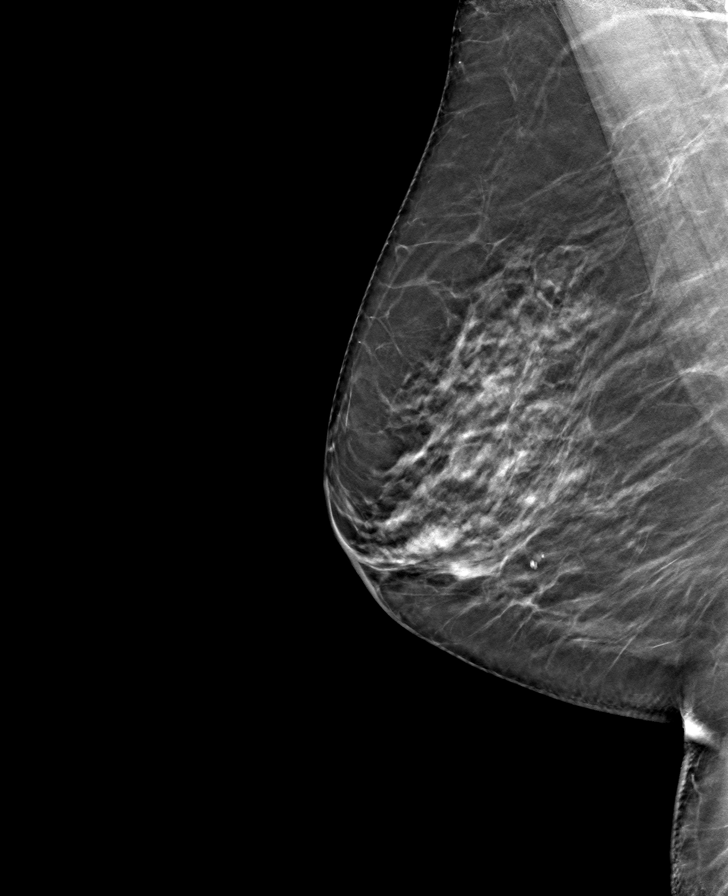

[L CC tomo · tomo slice 31/61.0]
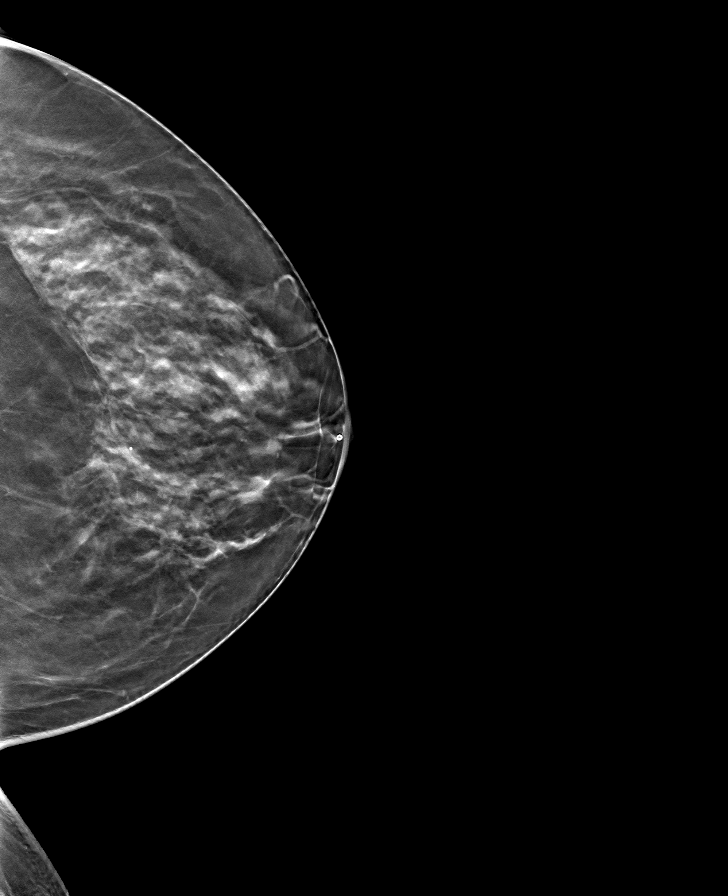

[8 of 24 positions shown; findings below may reference images not displayed]

ACR Breast Density Category c: The breast tissue is heterogeneously
dense, which may obscure small masses.
FINDINGS: There are no findings suspicious for malignancy.
IMPRESSION: No mammographic evidence of malignancy. A result letter of this
screening mammogram will be mailed directly to the patient.

RECOMMENDATION:
Screening mammogram in one year. (Code:Q3-W-BC3)

BI-RADS CATEGORY  1: Negative.

## 2023-07-30 LAB — COLOGUARD
COLOGUARD: NEGATIVE
Cologuard: NEGATIVE

## 2023-07-30 LAB — EXTERNAL GENERIC LAB PROCEDURE: COLOGUARD: NEGATIVE

## 2023-08-02 ENCOUNTER — Encounter: Payer: Self-pay | Admitting: Physician Assistant

## 2023-08-29 ENCOUNTER — Encounter: Payer: Self-pay | Admitting: Internal Medicine

## 2023-08-29 ENCOUNTER — Ambulatory Visit: Payer: Medicare Other | Attending: Internal Medicine | Admitting: Internal Medicine

## 2023-08-29 VITALS — BP 130/68 | HR 66 | Ht 66.0 in | Wt 130.0 lb

## 2023-08-29 DIAGNOSIS — Z7984 Long term (current) use of oral hypoglycemic drugs: Secondary | ICD-10-CM | POA: Diagnosis not present

## 2023-08-29 DIAGNOSIS — I700 Atherosclerosis of aorta: Secondary | ICD-10-CM | POA: Insufficient documentation

## 2023-08-29 DIAGNOSIS — I10 Essential (primary) hypertension: Secondary | ICD-10-CM | POA: Diagnosis not present

## 2023-08-29 DIAGNOSIS — L720 Epidermal cyst: Secondary | ICD-10-CM

## 2023-08-29 DIAGNOSIS — R911 Solitary pulmonary nodule: Secondary | ICD-10-CM | POA: Insufficient documentation

## 2023-08-29 DIAGNOSIS — E782 Mixed hyperlipidemia: Secondary | ICD-10-CM

## 2023-08-29 DIAGNOSIS — R7303 Prediabetes: Secondary | ICD-10-CM

## 2023-08-29 DIAGNOSIS — J432 Centrilobular emphysema: Secondary | ICD-10-CM

## 2023-08-29 LAB — POCT GLYCOSYLATED HEMOGLOBIN (HGB A1C): HbA1c, POC (prediabetic range): 5.8 % (ref 5.7–6.4)

## 2023-08-29 LAB — GLUCOSE, POCT (MANUAL RESULT ENTRY): POC Glucose: 88 mg/dL (ref 70–99)

## 2023-08-29 MED ORDER — ATORVASTATIN CALCIUM 10 MG PO TABS
ORAL_TABLET | ORAL | 1 refills | Status: AC
Start: 2023-08-29 — End: ?

## 2023-08-29 MED ORDER — AMLODIPINE BESYLATE 10 MG PO TABS
10.0000 mg | ORAL_TABLET | Freq: Every day | ORAL | 2 refills | Status: AC
Start: 2023-08-29 — End: ?

## 2023-08-29 MED ORDER — HYDROCHLOROTHIAZIDE 25 MG PO TABS
25.0000 mg | ORAL_TABLET | Freq: Every day | ORAL | 1 refills | Status: AC
Start: 2023-08-29 — End: ?

## 2023-08-29 MED ORDER — METFORMIN HCL ER 500 MG PO TB24: 500.0000 mg | ORAL_TABLET | Freq: Every day | ORAL | 2 refills | Status: DC

## 2023-08-29 MED ORDER — VALSARTAN 80 MG PO TABS
80.0000 mg | ORAL_TABLET | Freq: Every day | ORAL | 3 refills | Status: AC
Start: 2023-08-29 — End: ?

## 2023-08-29 NOTE — Patient Instructions (Signed)
 VISIT SUMMARY:  During today's visit, we discussed several health concerns, including a possible allergic reaction to your blood pressure medication, a cyst on your back, and your ongoing management of prediabetes and hyperlipidemia. We also reviewed your recent CT scan results and made plans for follow-up care.  YOUR PLAN:  -LUNG NODULE: A small nodule was found in your right upper lung, which could be related to your smoking history. We will repeat a CT scan of your chest in 6-12 months to monitor its stability.  -MILD CENTRILOBULAR EMPHYSEMA: This is a mild form of lung disease often caused by smoking. Although you are currently asymptomatic, we will repeat a CT scan of your chest in 6-12 months to monitor its stability.  -HYPERTENSION: Your blood pressure is well-controlled, but the tingling in your lips suggests you might be allergic to lisinopril. Given your family history of lisinopril allergy, we will switch you to valsartan 80 mg daily and hydrochlorothiazide 25 mg daily. Please discontinue lisinopril/hydrochlorothiazide and start the new medications. Check your blood pressure twice a week and record the readings. Also, limit your salt intake.  -HYPERLIPIDEMIA: This condition involves high levels of fats in your blood. You are managing it well with atorvastatin 10 mg daily, and you should continue taking it, preferably in the evening.  -PREDIABETES: Your blood sugar levels are higher than normal but not high enough to be classified as diabetes. Your A1c has improved from 6.1% to 5.8% with lifestyle changes and metformin. Continue with your current dietary modifications and metformin to manage your blood glucose levels.  -CYST: This is a non-cancerous cyst that has increased in size and occasionally drains. We recommend seeing a general surgeon for evaluation and possible removal of the cyst.  INSTRUCTIONS:  1. Discontinue lisinopril/hydrochlorothiazide and start taking valsartan 80 mg  daily and hydrochlorothiazide 25 mg daily as separate pills.  2. Check your blood pressure twice a week and record the readings.  3. Limit your salt intake.  4. Continue taking atorvastatin 10 mg daily, preferably in the evening.  5. Maintain your current dietary modifications and continue taking metformin to manage your blood glucose levels.  6. Schedule an appointment with a general surgeon for evaluation and possible removal of the cyst.  7. Repeat CT scan of your chest in 6-12 months to monitor the lung nodule and emphysema stability.

## 2023-08-29 NOTE — Progress Notes (Signed)
 Patient ID: Debbie Barnett, female    DOB: 12-06-1955  MRN: [id number]  CC: Hypertension (HTN & prediabetes f/u. Med refill. Coralie Keens in BP record /Suspects allergic reaction to lisinopril - tingling of lips - family hx of allergies with lisinopril/Discuss atorvastatin & frequency needed )   Subjective: Debbie Barnett is a 68 y.o. female who presents for chronic ds management.  Previous PCP is Dr. Delford Field who has retired. Her concerns today include:  Pt with hx of HTN, HL, PreDM, right upper lobe lung nodule on CT done 04/2023, aortic atherosclerosis, centrilobular and paraseptal emphysema seen on CAT scan 04/2023  Discussed the use of AI scribe software for clinical note transcription with the patient, who gave verbal consent to proceed.  History of Present Illness   Debbie Barnett, a patient with a history of hypertension, hyperlipidemia, prediabetes, and emphysema, presents for f/u visit.  HTN: pt thinks she may be having an allergic reaction to lisinopril.  Reports swelling of the lips one one occasion 2 years ago when she first started taking lisinopril.  States that she reported to Dr. Delford Field but there was some thought that she may have had reaction to being bitten by fire ants around the same time.  States that she was talking to her sister the other day whom mention that both her mother and sister had severe allergic reactions to lisinopril.  Recently she has noticed tingling sensation in her lips after taking lisinopril.  She is not sure whether this is in her head or whether she has an allergic reaction to the lisinopril.  She would like to be tried with a different blood pressure medication.  She is currently on amlodipine 10 mg daily and lisinopril/hydrochlorothiazide 20/25 mg daily. She brings a log of blood pressure readings with her.  Some of her recent readings were 111/74, 104/67, 130/79.  Emphysema is listed on patient's problem list but patient states she was not aware of it.  I went  back and look at the CAT scan that she had done in December 2024 for workup of night sweats.  This showed predominant centrilobular and paraseptal emphysema.  She is a former smoker who quit in 2014.  CAT scan also showed a spiculated nodule within an area of right apical pleural-parenchymal scarring measuring 1 x 0.5 cm.  Radiologist recommended repeat CAT scan in 6 to 12 months.  Patient states she was not aware of the results.  In addition to the suspected allergic reaction, Melva has a cyst on her back that has been present for a couple of years but has recently increased in size and become bothersome over the past two months. The cyst was initially the size of a pea but has since grown and occasionally drains a cheesy substance.  She has been seeing a dermatologist who refused to lance it.  Prediabetes:  Results for orders placed or performed in visit on 08/29/23  POCT glucose (manual entry)   Collection Time: 08/29/23  3:44 PM  Result Value Ref Range   POC Glucose 88 70 - 99 mg/dl  POCT glycosylated hemoglobin (Hb A1C)   Collection Time: 08/29/23  3:51 PM  Result Value Ref Range   Hemoglobin A1C     HbA1c POC (<> result, manual entry)     HbA1c, POC (prediabetic range) 5.8 5.7 - 6.4 %   HbA1c, POC (controlled diabetic range)    [name] has been managing her prediabetes by eliminating sugar and carbohydrates from her diet. This  lifestyle change has resulted in a significant weight loss of 40 pounds and a decrease in her A1c from 6.1 to 5.8.   HL: She reports no muscle aches or cramps with atorvastatin and has been taking it daily despite the printout indicating it should be taken four times a week. Print out was a mistake as Dr. Florene Route last rxn is written for daily use.       Patient Active Problem List   Diagnosis Date Noted   Right upper lobe spiculated scar with centrilobular emphysema 04/21/2023   Centrilobular emphysema (HCC) 04/21/2023   CMV (cytomegalovirus) antibody positive  04/02/2023   Vaginal discharge 03/30/2023   Chronic night sweats 03/30/2023   Prediabetes 09/21/2021   Skin tag 09/20/2021   Encounter for screening mammogram for malignant neoplasm of breast 09/20/2021   Need for vaccination for zoster 09/20/2021   Dental caries 09/20/2021   Hyperlipidemia 04/08/2020   HTN (hypertension) 01/17/2019     Current Outpatient Medications on File Prior to Visit  Medication Sig Dispense Refill   amLODipine (NORVASC) 10 MG tablet Take 1 tablet (10 mg total) by mouth daily. 90 tablet 2   atorvastatin (LIPITOR) 10 MG tablet Take 1 tablet by mouth once daily 90 tablet 1   lisinopril-hydrochlorothiazide (ZESTORETIC) 20-25 MG tablet Take 1 tablet by mouth daily. 90 tablet 3   metFORMIN (GLUCOPHAGE-XR) 500 MG 24 hr tablet Take 1 tablet (500 mg total) by mouth daily with breakfast. 90 tablet 2   psyllium (REGULOID) 0.52 g capsule Take 0.52 g by mouth daily.     Specialty Vitamins Products (VITAMINS FOR HAIR PO) Take by mouth.     Ascorbic Acid (VITAMIN C) 100 MG tablet Take 100 mg by mouth daily. (Patient not taking: Reported on 08/29/2023)     benzonatate (TESSALON) 100 MG capsule Take 1-2 caps PO TID PRN (Patient not taking: Reported on 08/29/2023) 20 capsule 0   calcium-vitamin D (OSCAL WITH D) 500-5 MG-MCG tablet Take 1 tablet by mouth. (Patient not taking: Reported on 08/29/2023)     cetirizine (ZYRTEC ALLERGY) 10 MG tablet Take 1 tablet (10 mg total) by mouth daily. (Patient not taking: Reported on 08/29/2023) 30 tablet 11   No current facility-administered medications on file prior to visit.    No Known Allergies  Social History   Socioeconomic History   Marital status: Single    Spouse name: Not on file   Number of children: Not on file   Years of education: Not on file   Highest education level: Not on file  Occupational History   Occupation: Fish farm manager  Tobacco Use   Smoking status: Former    Current packs/day: 0.00    Types: Cigarettes     Quit date: 2013    Years since quitting: 12.2   Smokeless tobacco: Never  Substance and Sexual Activity   Alcohol use: Yes    Comment: rare, approximately twice a year   Drug use: Not Currently    Types: Cocaine, Marijuana    Comment: remote IVDU once in late 1970s   Sexual activity: Yes    Birth control/protection: Condom  Other Topics Concern   Not on file  Social History Narrative   Not on file   Social Drivers of Health   Financial Resource Strain: Low Risk  (11/29/2022)   Overall Financial Resource Strain (CARDIA)    Difficulty of Paying Living Expenses: Not hard at all  Food Insecurity: No Food Insecurity (11/29/2022)   Hunger Vital Sign  Worried About Programme researcher, broadcasting/film/video in the Last Year: Never true    Ran Out of Food in the Last Year: Never true  Transportation Needs: No Transportation Needs (11/29/2022)   PRAPARE - Administrator, Civil Service (Medical): No    Lack of Transportation (Non-Medical): No  Physical Activity: Sufficiently Active (11/29/2022)   Exercise Vital Sign    Days of Exercise per Week: 5 days    Minutes of Exercise per Session: 60 min  Stress: No Stress Concern Present (11/29/2022)   [employer] of Occupational Health - Occupational Stress Questionnaire    Feeling of Stress : Not at all  Social Connections: Socially Isolated (11/29/2022)   Social Connection and Isolation Panel [NHANES]    Frequency of Communication with Friends and Family: More than three times a week    Frequency of Social Gatherings with Friends and Family: Three times a week    Attends Religious Services: Never    Active Member of Clubs or Organizations: No    Attends Banker Meetings: Never    Marital Status: Divorced  Catering manager Violence: Not At Risk (11/29/2022)   Humiliation, Afraid, Rape, and Kick questionnaire    Fear of Current or Ex-Partner: No    Emotionally Abused: No    Physically Abused: No    Sexually Abused: No     Family History  Problem Relation Age of Onset   Kidney disease Mother    Heart disease Father    Obesity Sister    Heart disease Brother    Hypertension Brother    Alcohol abuse Maternal Grandfather     Past Surgical History:  Procedure Laterality Date   ABDOMINAL HYSTERECTOMY      ROS: Review of Systems Negative except as stated above  PHYSICAL EXAM: BP 130/68 (BP Location: Left Arm, Patient Position: Sitting, Cuff Size: Normal)   Pulse 66   Ht 5\' 6"  (1.676 m)   Wt 130 lb (59 kg)   SpO2 99%   BMI 20.98 kg/m   Physical Exam  General appearance - alert, well appearing, and in no distress Mental status - normal mood, behavior, speech, dress, motor activity, and thought processes Neck - supple, no significant adenopathy Chest - clear to auscultation, no wheezes, rales or rhonchi, symmetric air entry Heart -regular rate and rhythm, 1/6 systolic ejection murmur along the left sternal border Extremities - peripheral pulses normal, no pedal edema, no clubbing or cyanosis Skin -2.5 x 2 cm raised nonerythematous soft cyst.  Small amount of cheesy white material expelled when I pressed on it.  Results for orders placed or performed in visit on 08/29/23  POCT glucose (manual entry)   Collection Time: 08/29/23  3:44 PM  Result Value Ref Range   POC Glucose 88 70 - 99 mg/dl  POCT glycosylated hemoglobin (Hb A1C)   Collection Time: 08/29/23  3:51 PM  Result Value Ref Range   Hemoglobin A1C     HbA1c POC (<> result, manual entry)     HbA1c, POC (prediabetic range) 5.8 5.7 - 6.4 %   HbA1c, POC (controlled diabetic range)         Latest Ref Rng & Units 03/30/2023    4:00 PM 11/10/2022    9:42 AM 02/22/2022    3:48 PM  CMP  Glucose 70 - 99 mg/dL 92  161  85   BUN 8 - 27 mg/dL 17  30  23    Creatinine 0.57 - 1.00 mg/dL 0.96  0.96  0.95   Sodium 134 - 144 mmol/L 142  138  140   Potassium 3.5 - 5.2 mmol/L 4.4  4.7  4.5   Chloride 96 - 106 mmol/L 99  100  102   CO2 20 -  29 mmol/L 21  23  24    Calcium 8.7 - 10.3 mg/dL 9.7  9.9  9.7   Total Protein 6.0 - 8.5 g/dL 7.4  7.0  6.8   Total Bilirubin 0.0 - 1.2 mg/dL 0.3  0.3  0.3   Alkaline Phos 44 - 121 IU/L 89  57  56   AST 0 - 40 IU/L 33  25  23   ALT 0 - 32 IU/L 38  25  18    Lipid Panel     Component Value Date/Time   CHOL 204 (H) 11/10/2022 0942   TRIG 116 11/10/2022 0942   HDL 84 11/10/2022 0942   CHOLHDL 2.4 11/10/2022 0942   LDLCALC 100 (H) 11/10/2022 0942    CBC    Component Value Date/Time   WBC 6.2 03/30/2023 1600   WBC 6.8 12/21/2018 1307   RBC 4.15 03/30/2023 1600   RBC 4.58 12/21/2018 1307   HGB 12.6 03/30/2023 1600   HCT 38.1 03/30/2023 1600   PLT 240 03/30/2023 1600   MCV 92 03/30/2023 1600   MCH 30.4 03/30/2023 1600   MCH 28.8 12/21/2018 1307   MCHC 33.1 03/30/2023 1600   MCHC 31.7 12/21/2018 1307   RDW 12.8 03/30/2023 1600   LYMPHSABS 2.1 03/30/2023 1600   MONOABS 0.9 12/21/2018 1307   EOSABS 0.2 03/30/2023 1600   BASOSABS 0.1 03/30/2023 1600    ASSESSMENT AND PLAN: 1. Essential hypertension (Primary) At goal. Advised to continue to monitor blood pressure at least twice a week and bring readings with her at each visit. Given the tingling that she gets in her lip when she takes lisinopril, we will go ahead and discontinue lisinopril/HCTZ.  We will place her on Diovan 80 mg daily and hydrochlorothiazide 25 mg daily instead.  Continue amlodipine. - amLODipine (NORVASC) 10 MG tablet; Take 1 tablet (10 mg total) by mouth daily.  Dispense: 90 tablet; Refill: 2 - valsartan (DIOVAN) 80 MG tablet; Take 1 tablet (80 mg total) by mouth daily.  Dispense: 90 tablet; Refill: 3 - hydrochlorothiazide (HYDRODIURIL) 25 MG tablet; Take 1 tablet (25 mg total) by mouth daily.  Dispense: 90 tablet; Refill: 1  2. Prediabetes Patient has done well.  Encouraged her to continue healthy eating habits and regular exercise - POCT glycosylated hemoglobin (Hb A1C) - POCT glucose (manual entry) -  metFORMIN (GLUCOPHAGE-XR) 500 MG 24 hr tablet; Take 1 tablet (500 mg total) by mouth daily with breakfast.  Dispense: 90 tablet; Refill: 2  3. Aortic atherosclerosis (HCC) Discussed this diagnosis with her.  She is already on atorvastatin.  LDL on last cholesterol level checked in June 2024 was 100.  4. Mixed hyperlipidemia Continue atorvastatin - atorvastatin (LIPITOR) 10 MG tablet; Take 1 tablet by mouth once daily  Dispense: 90 tablet; Refill: 1  5. Lung nodule We will plan to repeat CAT scan of the chest between June and December of this year for follow-up on this nodule given prior history of cigarette smoking.  Commended her on quitting.  6. Centrilobular emphysema (HCC) Patient is asymptomatic.  7. Epidermoid cyst of skin of back I recommend referral to a general surgeon to have this excised. - Ambulatory referral to General Surgery     Patient  was given the opportunity to ask questions.  Patient verbalized understanding of the plan and was able to repeat key elements of the plan.   This documentation was completed using Paediatric nurse.  Any transcriptional errors are unintentional.  Orders Placed This Encounter  Procedures   POCT glycosylated hemoglobin (Hb A1C)   POCT glucose (manual entry)     Requested Prescriptions   Pending Prescriptions Disp Refills   metFORMIN (GLUCOPHAGE-XR) 500 MG 24 hr tablet 90 tablet 2    Sig: Take 1 tablet (500 mg total) by mouth daily with breakfast.   atorvastatin (LIPITOR) 10 MG tablet 90 tablet 1    Sig: Take 1 tablet by mouth once daily   amLODipine (NORVASC) 10 MG tablet 90 tablet 2    Sig: Take 1 tablet (10 mg total) by mouth daily.    No follow-ups on file.  Concetta Dee, MD, FACP

## 2023-12-26 ENCOUNTER — Other Ambulatory Visit: Payer: Self-pay | Admitting: Critical Care Medicine

## 2023-12-26 ENCOUNTER — Ambulatory Visit: Admitting: Internal Medicine

## 2023-12-26 DIAGNOSIS — Z5321 Procedure and treatment not carried out due to patient leaving prior to being seen by health care provider: Secondary | ICD-10-CM

## 2023-12-27 NOTE — Progress Notes (Signed)
 Left before being pulled back from waiting area by CMA.

## 2023-12-29 ENCOUNTER — Telehealth: Payer: Self-pay | Admitting: Internal Medicine

## 2023-12-29 NOTE — Telephone Encounter (Signed)
-----   Message from [place] C sent at 12/27/2023  4:10 PM EDT ----- Regarding: RE: Appt Contacted pt left vm to call back to resch appt ----- Message ----- From: Vicci Barnie NOVAK, MD Sent: 12/27/2023  11:06 AM EDT To: [facility] Subject: Appt                                           Pt left yesterday before being called back by my CMA. See if she wants to reschedule.

## 2024-03-04 ENCOUNTER — Other Ambulatory Visit: Payer: Self-pay | Admitting: Family Medicine

## 2024-03-04 DIAGNOSIS — Z1231 Encounter for screening mammogram for malignant neoplasm of breast: Secondary | ICD-10-CM

## 2024-03-06 ENCOUNTER — Ambulatory Visit

## 2024-03-14 ENCOUNTER — Ambulatory Visit
Admission: RE | Admit: 2024-03-14 | Discharge: 2024-03-14 | Disposition: A | Source: Ambulatory Visit | Attending: Family Medicine

## 2024-03-14 DIAGNOSIS — Z1231 Encounter for screening mammogram for malignant neoplasm of breast: Secondary | ICD-10-CM

## 2024-03-28 ENCOUNTER — Other Ambulatory Visit: Payer: Self-pay | Admitting: Family Medicine

## 2024-03-28 DIAGNOSIS — Z8619 Personal history of other infectious and parasitic diseases: Secondary | ICD-10-CM

## 2024-04-08 ENCOUNTER — Other Ambulatory Visit: Payer: Self-pay | Admitting: Family Medicine

## 2024-04-08 ENCOUNTER — Ambulatory Visit
Admission: RE | Admit: 2024-04-08 | Discharge: 2024-04-08 | Disposition: A | Source: Ambulatory Visit | Attending: Family Medicine

## 2024-04-08 DIAGNOSIS — R911 Solitary pulmonary nodule: Secondary | ICD-10-CM

## 2024-04-08 DIAGNOSIS — Z8619 Personal history of other infectious and parasitic diseases: Secondary | ICD-10-CM

## 2024-04-17 ENCOUNTER — Ambulatory Visit
Admission: RE | Admit: 2024-04-17 | Discharge: 2024-04-17 | Disposition: A | Source: Ambulatory Visit | Attending: Family Medicine

## 2024-04-17 DIAGNOSIS — R911 Solitary pulmonary nodule: Secondary | ICD-10-CM

## 2024-06-16 ENCOUNTER — Other Ambulatory Visit: Payer: Self-pay | Admitting: Internal Medicine

## 2024-06-16 DIAGNOSIS — R7303 Prediabetes: Secondary | ICD-10-CM
